# Patient Record
Sex: Female | Born: 1945 | Race: White | Hispanic: No | Marital: Married | Smoking: Former smoker
Health system: Southern US, Community
[De-identification: ages and names within clinical notes are randomized; demographics above are authoritative.]

## PROBLEM LIST (undated history)

## (undated) DIAGNOSIS — E279 Disorder of adrenal gland, unspecified: Secondary | ICD-10-CM

## (undated) DIAGNOSIS — C569 Malignant neoplasm of unspecified ovary: Secondary | ICD-10-CM

## (undated) DIAGNOSIS — IMO0002 Reserved for concepts with insufficient information to code with codable children: Secondary | ICD-10-CM

## (undated) DIAGNOSIS — I10 Essential (primary) hypertension: Secondary | ICD-10-CM

## (undated) DIAGNOSIS — E1151 Type 2 diabetes mellitus with diabetic peripheral angiopathy without gangrene: Secondary | ICD-10-CM

## (undated) DIAGNOSIS — I2510 Atherosclerotic heart disease of native coronary artery without angina pectoris: Secondary | ICD-10-CM

## (undated) HISTORY — DX: Reserved for concepts with insufficient information to code with codable children: IMO0002

## (undated) HISTORY — DX: Type 2 diabetes mellitus with diabetic peripheral angiopathy without gangrene: E11.51

## (undated) HISTORY — PX: PARTIAL HYSTERECTOMY: SHX80

## (undated) HISTORY — PX: TONSILLECTOMY: SUR1361

## (undated) HISTORY — PX: CHOLECYSTECTOMY: SHX55

## (undated) HISTORY — DX: Disorder of adrenal gland, unspecified: E27.9

## (undated) HISTORY — DX: Essential (primary) hypertension: I10

## (undated) HISTORY — PX: CARDIAC CATHETERIZATION: SHX172

## (undated) HISTORY — DX: Malignant neoplasm of unspecified ovary: C56.9

## (undated) HISTORY — PX: ABDOMINAL HYSTERECTOMY: SHX81

## (undated) HISTORY — DX: Atherosclerotic heart disease of native coronary artery without angina pectoris: I25.10

---

## 1999-05-28 DIAGNOSIS — E1151 Type 2 diabetes mellitus with diabetic peripheral angiopathy without gangrene: Secondary | ICD-10-CM

## 1999-05-28 HISTORY — DX: Type 2 diabetes mellitus with diabetic peripheral angiopathy without gangrene: E11.51

## 2003-06-16 ENCOUNTER — Other Ambulatory Visit: Payer: Self-pay

## 2003-06-17 ENCOUNTER — Other Ambulatory Visit: Payer: Self-pay

## 2004-03-15 ENCOUNTER — Ambulatory Visit: Payer: Self-pay | Admitting: Physician Assistant

## 2004-04-12 ENCOUNTER — Ambulatory Visit: Payer: Self-pay | Admitting: Physician Assistant

## 2004-05-09 ENCOUNTER — Ambulatory Visit: Payer: Self-pay | Admitting: Pain Medicine

## 2004-05-22 ENCOUNTER — Ambulatory Visit: Payer: Self-pay | Admitting: Pain Medicine

## 2004-05-25 ENCOUNTER — Ambulatory Visit: Payer: Self-pay | Admitting: Pain Medicine

## 2004-05-30 ENCOUNTER — Ambulatory Visit: Payer: Self-pay

## 2004-06-04 ENCOUNTER — Ambulatory Visit: Payer: Self-pay | Admitting: Pain Medicine

## 2004-06-12 ENCOUNTER — Ambulatory Visit: Payer: Self-pay | Admitting: Pain Medicine

## 2004-07-04 ENCOUNTER — Ambulatory Visit: Payer: Self-pay | Admitting: Physician Assistant

## 2004-07-31 ENCOUNTER — Ambulatory Visit: Payer: Self-pay | Admitting: Physician Assistant

## 2004-08-28 ENCOUNTER — Ambulatory Visit: Payer: Self-pay | Admitting: Physician Assistant

## 2004-10-04 ENCOUNTER — Ambulatory Visit: Payer: Self-pay | Admitting: Physician Assistant

## 2004-11-01 ENCOUNTER — Ambulatory Visit: Payer: Self-pay | Admitting: Physician Assistant

## 2004-11-29 ENCOUNTER — Ambulatory Visit: Payer: Self-pay | Admitting: Physician Assistant

## 2005-01-04 ENCOUNTER — Ambulatory Visit: Payer: Self-pay | Admitting: Physician Assistant

## 2005-01-24 ENCOUNTER — Ambulatory Visit: Payer: Self-pay | Admitting: Physician Assistant

## 2005-01-31 ENCOUNTER — Ambulatory Visit: Payer: Self-pay | Admitting: Physician Assistant

## 2005-03-13 ENCOUNTER — Ambulatory Visit: Payer: Self-pay | Admitting: Physician Assistant

## 2005-03-20 ENCOUNTER — Ambulatory Visit: Payer: Self-pay | Admitting: Physician Assistant

## 2005-04-16 ENCOUNTER — Ambulatory Visit: Payer: Self-pay | Admitting: Physician Assistant

## 2005-05-14 ENCOUNTER — Ambulatory Visit: Payer: Self-pay | Admitting: Physician Assistant

## 2005-06-14 ENCOUNTER — Ambulatory Visit: Payer: Self-pay | Admitting: Physician Assistant

## 2005-07-24 ENCOUNTER — Ambulatory Visit: Payer: Self-pay | Admitting: Physician Assistant

## 2005-08-26 ENCOUNTER — Ambulatory Visit: Payer: Self-pay | Admitting: Physician Assistant

## 2005-09-23 ENCOUNTER — Ambulatory Visit: Payer: Self-pay | Admitting: Physician Assistant

## 2005-09-24 ENCOUNTER — Encounter: Payer: Self-pay | Admitting: Pain Medicine

## 2005-10-23 ENCOUNTER — Ambulatory Visit: Payer: Self-pay | Admitting: Internal Medicine

## 2005-10-24 ENCOUNTER — Ambulatory Visit: Payer: Self-pay | Admitting: Physician Assistant

## 2005-11-20 ENCOUNTER — Ambulatory Visit: Payer: Self-pay | Admitting: Physician Assistant

## 2005-12-26 ENCOUNTER — Ambulatory Visit: Payer: Self-pay | Admitting: Physician Assistant

## 2006-02-06 ENCOUNTER — Ambulatory Visit: Payer: Self-pay | Admitting: Physician Assistant

## 2006-03-10 ENCOUNTER — Ambulatory Visit: Payer: Self-pay | Admitting: Physician Assistant

## 2006-04-08 ENCOUNTER — Ambulatory Visit: Payer: Self-pay | Admitting: Physician Assistant

## 2006-05-08 ENCOUNTER — Ambulatory Visit: Payer: Self-pay | Admitting: Physician Assistant

## 2006-06-05 ENCOUNTER — Ambulatory Visit: Payer: Self-pay | Admitting: Internal Medicine

## 2006-06-17 ENCOUNTER — Ambulatory Visit: Payer: Self-pay | Admitting: Physician Assistant

## 2006-07-17 ENCOUNTER — Ambulatory Visit: Payer: Self-pay | Admitting: Physician Assistant

## 2006-08-13 ENCOUNTER — Ambulatory Visit: Payer: Self-pay | Admitting: Physician Assistant

## 2006-09-11 ENCOUNTER — Ambulatory Visit: Payer: Self-pay | Admitting: Physician Assistant

## 2006-10-21 ENCOUNTER — Ambulatory Visit: Payer: Self-pay | Admitting: Physician Assistant

## 2006-11-17 ENCOUNTER — Ambulatory Visit: Payer: Self-pay | Admitting: Physician Assistant

## 2006-12-18 ENCOUNTER — Ambulatory Visit: Payer: Self-pay | Admitting: Physician Assistant

## 2006-12-23 ENCOUNTER — Encounter: Payer: Self-pay | Admitting: Pain Medicine

## 2007-01-21 ENCOUNTER — Ambulatory Visit: Payer: Self-pay | Admitting: Physician Assistant

## 2007-02-17 ENCOUNTER — Ambulatory Visit: Payer: Self-pay | Admitting: Physician Assistant

## 2007-03-23 ENCOUNTER — Ambulatory Visit: Payer: Self-pay | Admitting: Physician Assistant

## 2007-06-18 ENCOUNTER — Ambulatory Visit: Payer: Self-pay | Admitting: Physician Assistant

## 2007-07-24 ENCOUNTER — Ambulatory Visit: Payer: Self-pay | Admitting: Physician Assistant

## 2007-10-20 ENCOUNTER — Ambulatory Visit: Payer: Self-pay | Admitting: Physician Assistant

## 2007-11-19 ENCOUNTER — Ambulatory Visit: Payer: Self-pay | Admitting: Physician Assistant

## 2007-12-17 ENCOUNTER — Ambulatory Visit: Payer: Self-pay | Admitting: Physician Assistant

## 2008-01-20 ENCOUNTER — Ambulatory Visit: Payer: Self-pay | Admitting: Internal Medicine

## 2008-01-21 ENCOUNTER — Ambulatory Visit: Payer: Self-pay | Admitting: Internal Medicine

## 2008-03-17 ENCOUNTER — Ambulatory Visit: Payer: Self-pay | Admitting: Physician Assistant

## 2008-04-13 ENCOUNTER — Ambulatory Visit: Payer: Self-pay | Admitting: Physician Assistant

## 2008-06-16 ENCOUNTER — Ambulatory Visit: Payer: Self-pay | Admitting: Pain Medicine

## 2008-09-19 ENCOUNTER — Ambulatory Visit: Payer: Self-pay | Admitting: Physician Assistant

## 2008-12-15 ENCOUNTER — Ambulatory Visit: Payer: Self-pay | Admitting: Physician Assistant

## 2009-01-24 ENCOUNTER — Ambulatory Visit: Payer: Self-pay | Admitting: Physician Assistant

## 2009-02-07 ENCOUNTER — Ambulatory Visit: Payer: Self-pay | Admitting: Pain Medicine

## 2009-02-16 ENCOUNTER — Ambulatory Visit: Payer: Self-pay | Admitting: Physician Assistant

## 2009-04-13 ENCOUNTER — Ambulatory Visit: Payer: Self-pay | Admitting: Physician Assistant

## 2009-05-09 ENCOUNTER — Ambulatory Visit: Payer: Self-pay | Admitting: Pain Medicine

## 2009-05-16 ENCOUNTER — Ambulatory Visit: Payer: Self-pay | Admitting: Physician Assistant

## 2009-06-01 ENCOUNTER — Ambulatory Visit: Payer: Self-pay | Admitting: General Surgery

## 2009-06-13 ENCOUNTER — Ambulatory Visit: Payer: Self-pay | Admitting: General Surgery

## 2009-06-15 ENCOUNTER — Ambulatory Visit: Payer: Self-pay | Admitting: Physician Assistant

## 2009-06-15 ENCOUNTER — Encounter (INDEPENDENT_AMBULATORY_CARE_PROVIDER_SITE_OTHER): Payer: Self-pay | Admitting: *Deleted

## 2009-07-13 ENCOUNTER — Ambulatory Visit: Payer: Self-pay | Admitting: Pain Medicine

## 2009-08-03 ENCOUNTER — Ambulatory Visit: Payer: Self-pay | Admitting: General Surgery

## 2009-08-03 ENCOUNTER — Ambulatory Visit: Payer: Self-pay | Admitting: Internal Medicine

## 2009-08-22 ENCOUNTER — Ambulatory Visit: Payer: Self-pay | Admitting: General Surgery

## 2009-11-29 ENCOUNTER — Ambulatory Visit: Payer: Self-pay | Admitting: Internal Medicine

## 2010-03-02 ENCOUNTER — Encounter: Payer: Self-pay | Admitting: Cardiovascular Disease

## 2010-03-13 ENCOUNTER — Other Ambulatory Visit: Payer: Self-pay

## 2010-03-19 ENCOUNTER — Ambulatory Visit: Payer: Self-pay | Admitting: Cardiovascular Disease

## 2010-03-19 ENCOUNTER — Observation Stay: Payer: Self-pay | Admitting: Internal Medicine

## 2010-03-20 ENCOUNTER — Encounter: Payer: Self-pay | Admitting: Cardiovascular Disease

## 2010-03-26 ENCOUNTER — Encounter: Payer: Self-pay | Admitting: Cardiovascular Disease

## 2010-03-30 ENCOUNTER — Ambulatory Visit: Payer: Self-pay | Admitting: Cardiovascular Disease

## 2010-04-16 ENCOUNTER — Ambulatory Visit: Payer: Self-pay | Admitting: Cardiovascular Disease

## 2010-04-16 DIAGNOSIS — I2510 Atherosclerotic heart disease of native coronary artery without angina pectoris: Secondary | ICD-10-CM | POA: Insufficient documentation

## 2010-04-16 DIAGNOSIS — I739 Peripheral vascular disease, unspecified: Secondary | ICD-10-CM

## 2010-04-16 DIAGNOSIS — E785 Hyperlipidemia, unspecified: Secondary | ICD-10-CM

## 2010-04-16 DIAGNOSIS — E119 Type 2 diabetes mellitus without complications: Secondary | ICD-10-CM

## 2010-05-15 ENCOUNTER — Ambulatory Visit: Payer: Self-pay

## 2010-06-26 ENCOUNTER — Encounter: Payer: Self-pay | Admitting: Cardiovascular Disease

## 2010-06-26 ENCOUNTER — Ambulatory Visit: Admission: RE | Admit: 2010-06-26 | Discharge: 2010-06-26 | Payer: Self-pay | Source: Home / Self Care

## 2010-06-26 NOTE — Letter (Signed)
Summary: Cardiac Catheterization Instructions- Main Lab  [facility] at [facility] [address]   [place], [place] [address]   Phone: [phone]  Fax: [phone]     03/26/2010 MRN: [id number]  [facility] [address] [place], [place]  [address]  Dear Ms. Kitchen,   You are scheduled for Cardiac Catheterization on  November 4th              with Dr.Gollan.  Please arrive at the [facility] at 7 a.m. on the day of your procedure.  1. DIET     __x__ Nothing to eat or drink after midnight except your medications with a sip of water.  2. Come to the [place] office on  10/31   3. MAKE SURE YOU TAKE YOUR ASPIRIN.  4. _____ DO NOT TAKE these medications before your procedure:         ________________________________________________________________________________      ____ YOU MAY TAKE ALL of your remaining medications with a small amount of water.   5. Plan for one night stay - bring personal belongings (i.e. toothpaste, toothbrush, etc.)  6. Bring a current list of your medications and current insurance cards.  7. Must have a responsible person to drive you home.   8. Someone must be with yu for the first 24 hours after you arrive home.  9. Please wear clothes that are easy to get on and off and wear slip-on shoes.  *Special note: Every effort is made to have your procedure done on time.  Occasionally there are emergencies that present themselves at the hospital that may cause delays.  Please be patient if a delay does occur.  If you have any questions after you get home, please call the office at the number listed above.  Benedict Needy, RN

## 2010-06-26 NOTE — Letter (Signed)
[facility]   [facility]   Imported By: Roderic Ovens 03/23/2010 16:10:34  _____________________________________________________________________  External Attachment:    Type:   Image     Comment:   External Document

## 2010-06-26 NOTE — Assessment & Plan Note (Signed)
Summary: NP6/AMD   Visit Type:  Initial Consult Primary Vilda Zollner:  Tora Kindred Khan,M.D.  CC:  F/U [facility]. Had one spell of chest pain since left [facility].Marland Kitchen  History of Present Illness: Janet Arnold a 65 year old woman with a coronary artery, recent admssion at [facility] for chest pain on 03/19/2010 also with history of hypertension, diabetes, hyperlipidemia, peripheral vascular disease with stents placed he iliac arteries on the left but the details are unavailable, and smoking who presents for followup after recent hospital discharge and cardiac catheterization.  Cardiac catheterization was performed given her symptoms of chest discomfort. She had mild to moderate distal left main disease, mild RCA disease estimated at 40%. Normal LV systolic function.  she has done well since her cardiac catheterization. She does have rare episodes of chest discomfort. She tried Imdur 15 mg this gave her a significant headache. she has stopped smoking since she was discharged, as has her husband.  No pain at the groin site from the cardiac catheterization.  She has had pain simvastatin. She was started on zetia 10 mg daily.  Most recent hemoglobin A1c is 6.8  EKG shows sinus bradycardia with rate 55 beats per minute, no significant ST or T wave changes  Preventive Screening-Counseling & Management  Alcohol-Tobacco     Smoking Status: quit  Caffeine-Diet-Exercise     Does Patient Exercise: yes      Drug Use:  no.    Current Medications (verified): 1)  Amlodipine Besylate 10 Mg Tabs (Amlodipine Besylate) .Marland Kitchen.. 1 Tablet By Mouth At Bedtime 2)  Metoprolol Tartrate 100 Mg Tabs (Metoprolol Tartrate) .Marland Kitchen.. 1 Tablet By Mouth Two Times A Day 3)  Pantoprazole Sodium 40 Mg Tbec (Pantoprazole Sodium) .Marland Kitchen.. 1 Tablet By Mouth Once Daily 4)  Oxycodone-Acetaminophen 5-325 Mg Tabs (Oxycodone-Acetaminophen) .Marland Kitchen.. 1 Up To 5 Times Once Daily As Needed 5)  Glimepiride 2 Mg Tabs (Glimepiride) .Marland Kitchen.. 1 Tablet By Mouth Two Times A Day 6)   Metformin Hcl 500 Mg Tabs (Metformin Hcl) .Marland Kitchen.. 1 Tablet By Mouth Once Daily 7)  Zocor 20 Mg Tabs (Simvastatin) .... One Tablet At Bedtime 8)  Aspir-Low 81 Mg Tbec (Aspirin) .... One Tablet Once Daily 9)  Nitrostat 0.4 Mg Subl (Nitroglycerin) .... One Tablet Once Daily As Needed 10)  Lyrica 75 Mg Caps (Pregabalin) .... Three Times A Day  Allergies (verified): 1)  ! Dilantin (Phenytoin Sodium Extended)  Past History:  Past Medical History: Last updated: 07/03/2009 Hypertension Diabetes DDD  Past Surgical History: Last updated: 07/03/2009 Cholecystectomy Hysterectomy-partial Tonsillectomy  Family History: Last updated: 05/12/2010 Mother: deceased; alzhemier's disease Father: deceased; cancer  Social History: Last updated: 2010/05/12 Retired  Married  Tobacco Use - Former. Smoked 1 1/2 PPD. Quit 03/30/2010. Alcohol Use - no Regular Exercise - yes--moderate Drug Use - no  Risk Factors: Exercise: yes (05/12/10)  Risk Factors: Smoking Status: quit (May 12, 2010)  Family History: Mother: deceased; alzhemier's disease Father: deceased; cancer  Social History: Retired  Married  Tobacco Use - Former. Smoked 1 1/2 PPD. Quit 03/30/2010. Alcohol Use - no Regular Exercise - yes--moderate Drug Use - no Smoking Status:  quit Does Patient Exercise:  yes Drug Use:  no  Review of Systems       The patient complains of chest pain.  The patient denies fever, weight loss, weight gain, vision loss, decreased hearing, hoarseness, syncope, dyspnea on exertion, peripheral edema, prolonged cough, abdominal pain, incontinence, muscle weakness, depression, and enlarged lymph nodes.    Vital Signs:  Patient profile:   64 year  old female Height:      64 inches Weight:      165.50 pounds BMI:     28.51 Pulse rate:   55 / minute BP sitting:   126 / 70  (left arm) Cuff size:   regular  Vitals Entered By: Bishop Dublin, CMA (April 16, 2010 2:56 PM)  Physical Exam  General:   Well developed, well nourished, in no acute distress. Head:  normocephalic and atraumatic Neck:  Neck supple, no JVD. No masses, thyromegaly or abnormal cervical nodes. Lungs:  Clear bilaterally to auscultation and percussion. Heart:  Non-displaced PMI, chest non-tender; regular rate and rhythm, S1, S2 without murmurs, rubs or gallops. Carotid upstroke normal, no bruit.  Pedals normal pulses. No edema, no varicosities. Abdomen:  Bowel sounds positive; abdomen soft and non-tender without masses Msk:  Back normal, normal gait. Muscle strength and tone normal. Pulses:  pulses normal in all 4 extremities Extremities:  No clubbing or cyanosis. Neurologic:  Alert and oriented x 3. Skin:  Intact without lesions or rashes. Psych:  Normal affect.   Impression & Recommendations:  Problem # 1:  CAD, NATIVE VESSEL (ICD-414.01) mild to moderate disease. We'll continue aggressive medical management. Goal LDL is less than 70 given her left main disease.  The following medications were removed from the medication list:    Isosorbide Mononitrate Cr 30 Mg Xr24h-tab (Isosorbide mononitrate) .Marland Kitchen... Take 1/2-1 tablet by mouth daily Her updated medication list for this problem includes:    Amlodipine Besylate 10 Mg Tabs (Amlodipine besylate) .Marland Kitchen... 1 tablet by mouth at bedtime    Metoprolol Tartrate 100 Mg Tabs (Metoprolol tartrate) .Marland Kitchen... 1 tablet by mouth two times a day    Aspir-low 81 Mg Tbec (Aspirin) ..... One tablet once daily    Nitrostat 0.4 Mg Subl (Nitroglycerin) ..... One tablet once daily as needed  Problem # 2:  PVD (ICD-443.9) she reports having stents placed in her legs. She has not had any ultrasound in over 5 years. We will order this for her. She reports having a ultrasound of her carotids recently we'll try to obtain these records.  Problem # 3:  HYPERLIPIDEMIA-MIXED (ICD-272.4) given her underlying left main disease, history of smoking, we have suggested that she retry Crestor 5 mg every  other day with her zetia.  Her updated medication list for this problem includes:    Crestor 5 Mg Tabs (Rosuvastatin calcium) .Marland Kitchen... Take 1 tablet by mouth every other day    Zetia 10 Mg Tabs (Ezetimibe) .Marland Kitchen... Take one tablet by mouth daily.  Problem # 4:  DM (ICD-250.00) We have encouraged her to continue good diabetes control with goal hemoglobin A1c less than 6.5.  Her updated medication list for this problem includes:    Glimepiride 2 Mg Tabs (Glimepiride) .Marland Kitchen... 1 tablet by mouth two times a day    Metformin Hcl 500 Mg Tabs (Metformin hcl) .Marland Kitchen... 1 tablet by mouth once daily    Aspir-low 81 Mg Tbec (Aspirin) ..... One tablet once daily  Other Orders: Arterial Duplex Lower Extremity (Arterial Duplex Low) Abdominal Aorta Duplex (Abd Aorta Duplex)  Patient Instructions: 1)  Your physician recommends that you schedule a follow-up appointment in: 6 months 2)  Your physician has recommended you make the following change in your medication: Start taking Crestor 5mg  every other day. 3)  Your physician has requested that you have an abdominal aorta duplex. During this test, an ultrasound is used to evaluate the aorta. Allow 30 minutes for this exam.  Do not eat after midnight the day before and avoid carbonated beverages. There are no restrictions or special instructions. 4)  Your physician has requested that you have a lower or upper extremity arterial duplex.  This test is an ultrasound of the arteries in the legs or arms.  It looks at arterial blood flow in the legs and arms.  Allow one hour for Lower and Upper Arterial scans. There are no restrictions or special instructions.

## 2010-06-26 NOTE — Cardiovascular Report (Signed)
Summary: [facility] - Cath  [facility] - Cath   Imported By: Marylou Mccoy 04/30/2010 14:59:34  _____________________________________________________________________  External Attachment:    Type:   Image     Comment:   External Document

## 2010-06-26 NOTE — Letter (Signed)
Summary: New Patient letter  [facility]  [address] [place], [place] [address]   Phone: [phone]  Fax: [phone]       06/15/2009 MRN: [id number]  [facility] [address] [place], [place]  [address]  Dear Ms. Pint,  Welcome to the Gastroenterology Division at [facility].    You are scheduled to see Dr.  Marina Goodell on 07/06/2009 at 9:15am on the 3rd floor at [facility], [address]  We ask that you try to arrive at our office 15 minutes prior to your appointment time to allow for check-in.  We would like you to complete the enclosed self-administered evaluation form prior to your visit and bring it with you on the day of your appointment.  We will review it with you.  Also, please bring a complete list of all your medications or, if you prefer, bring the medication bottles and we will list them.  Please bring your insurance card so that we may make a copy of it.  If your insurance requires a referral to see a specialist, please bring your referral form from your primary care physician.  Co-payments are due at the time of your visit and may be paid by cash, check or credit card.     Your office visit will consist of a consult with your physician (includes a physical exam), any laboratory testing he/she may order, scheduling of any necessary diagnostic testing (e.g. x-ray, ultrasound, CT-scan), and scheduling of a procedure (e.g. Endoscopy, Colonoscopy) if required.  Please allow enough time on your schedule to allow for any/all of these possibilities.    If you cannot keep your appointment, please call [phone] to cancel or reschedule prior to your appointment date.  This allows [place] the opportunity to schedule an appointment for another patient in need of care.  If you do not cancel or reschedule by 5 p.m. the business day prior to your appointment date, you will be charged a $50.00 late cancellation/no-show fee.    Thank you for  choosing [facility] for your medical needs.  We appreciate the opportunity to care for you.  Please visit [place] at our website  to learn more about our practice.                     Sincerely,                                                             The Gastroenterology Division

## 2010-06-26 NOTE — Miscellaneous (Signed)
Summary: Isosorbide  Clinical Lists Changes  Medications: Added new medication of ISOSORBIDE MONONITRATE CR 30 MG XR24H-TAB (ISOSORBIDE MONONITRATE) Take 1/2-1 tablet by mouth daily - Signed Rx of ISOSORBIDE MONONITRATE CR 30 MG XR24H-TAB (ISOSORBIDE MONONITRATE) Take 1/2-1 tablet by mouth daily;  #30 x 3;  Signed;  Entered by: Benedict Needy, RN;  Authorized by: Dossie Arbour MD;  Method used: [id number] [address], [place], [place], [place]  [address], Ph: [phone], Fax: [phone]    Prescriptions: ISOSORBIDE MONONITRATE CR 30 MG XR24H-TAB (ISOSORBIDE MONONITRATE) Take 1/2-1 tablet by mouth daily  #30 x 3   Entered by:   Benedict Needy, RN   Authorized by:   Dossie Arbour MD   Signed by:   Benedict Needy, RN on 03/30/2010   Method used:   [facility]ronically to        [facility] [phone]* (retail)       [address]       [place], [place]  [address]       Ph: [phone]       Fax: [phone]   RxID:   [id number]

## 2010-07-26 DIAGNOSIS — E279 Disorder of adrenal gland, unspecified: Secondary | ICD-10-CM

## 2010-07-26 HISTORY — DX: Disorder of adrenal gland, unspecified: E27.9

## 2010-07-30 ENCOUNTER — Ambulatory Visit: Payer: Self-pay | Admitting: Internal Medicine

## 2010-08-07 ENCOUNTER — Other Ambulatory Visit: Payer: Self-pay | Admitting: Unknown Physician Specialty

## 2010-08-14 ENCOUNTER — Ambulatory Visit: Payer: Self-pay | Admitting: Internal Medicine

## 2010-11-07 ENCOUNTER — Ambulatory Visit (INDEPENDENT_AMBULATORY_CARE_PROVIDER_SITE_OTHER): Payer: Medicare Other | Admitting: Cardiovascular Disease

## 2010-11-07 ENCOUNTER — Encounter: Payer: Self-pay | Admitting: Cardiovascular Disease

## 2010-11-07 DIAGNOSIS — I2510 Atherosclerotic heart disease of native coronary artery without angina pectoris: Secondary | ICD-10-CM

## 2010-11-07 DIAGNOSIS — R079 Chest pain, unspecified: Secondary | ICD-10-CM | POA: Insufficient documentation

## 2010-11-07 DIAGNOSIS — E785 Hyperlipidemia, unspecified: Secondary | ICD-10-CM

## 2010-11-07 DIAGNOSIS — I739 Peripheral vascular disease, unspecified: Secondary | ICD-10-CM

## 2010-11-07 DIAGNOSIS — E119 Type 2 diabetes mellitus without complications: Secondary | ICD-10-CM

## 2010-11-07 MED ORDER — NITROGLYCERIN 0.4 MG SL SUBL: 0.4000 mg | SUBLINGUAL_TABLET | SUBLINGUAL | Status: AC | PRN

## 2010-11-07 NOTE — Assessment & Plan Note (Signed)
Recent cardiac catheterization with noncritical disease. We'll continue medical management. We have stressed and she tried red yeast rice.

## 2010-11-07 NOTE — Assessment & Plan Note (Signed)
Chest pain is atypical in nature as detailed above. We have given her nitroglycerin more for symptom relief which might help her if she has esophageal spasm or hiatal hernia. We have suggested she talk with Dr. Mechele Collin and their group about moving to scheduling her for EGD. She is having some difficulty swallowing as well. Uncertain if this is from stricture or hiatal hernia or other etiology. She will call me if she has any symptoms with exertion and we would perform a treadmill though this would likely be low yield as she had a recent cardiac catheterization that showed no significant coronary artery disease.

## 2010-11-07 NOTE — Assessment & Plan Note (Signed)
No symptoms of claudication at this time. Again we will continue to work with her on her cholesterol. She is a nonsmoker.

## 2010-11-07 NOTE — Patient Instructions (Addendum)
You are doing well. No medication changes were made. Start Red Yeast Rice otc once daily. Please call us if you have new issues that need to be addressed before your next appt.  We will call you for a follow up Appt. 6 months

## 2010-11-07 NOTE — Assessment & Plan Note (Signed)
She has had a difficult time tolerating statins. Uncertain why she was not able to tolerate zetia. We have suggested red yeast rice, exercise and weight loss.

## 2010-11-07 NOTE — Assessment & Plan Note (Signed)
We have encouraged continued exercise, careful diet management in an effort to lose weight. 

## 2010-11-07 NOTE — Progress Notes (Signed)
   Patient ID: Janet Arnold, female    DOB: Sep 04, 1945, 65 y.o.   MRN: [id number]  HPI Comments: Janet Arnold a 65 year old woman with CAD, admssion at [facility] for chest pain on 03/19/2010 also with history of hypertension, diabetes, hyperlipidemia, peripheral vascular disease with stents placed in the iliac artery on the left but the details are unavailable, and h/o smoking who presents for followup.    Cardiac catheterization earlier this year was performed given her symptoms of chest discomfort. She had mild to moderate distal left main disease, mild RCA disease estimated at 40%. Normal LV systolic function.   She had done well following her cardiac catheterization until recently when she has been developing some right upper epigastric, central epigastric, chest pain in the central mediastinal region radiating up her neck on the right to her head. She reports it is worse with leaning forward, seems to be resolved with belching. It comes on at rest, sometimes while sitting and sometimes while sleeping. Sometimes food seems to make it better. She takes care of a 1-year-old grandchild and does not have any shortness of breath or chest discomfort when she is taking care of him and was walking with him.  She has had pain With statins  hemoglobin A1c is 6.8   EKG shows sinus bradycardia with rate 56 beats per minute, no significant ST or T wave changes      Review of Systems  Constitutional: Negative.   HENT: Negative.   Eyes: Negative.   Respiratory: Negative.   Cardiovascular: Positive for chest pain.  Gastrointestinal: Positive for abdominal pain.  Musculoskeletal: Negative.   Skin: Negative.   Neurological: Negative.   Hematological: Negative.   Psychiatric/Behavioral: Negative.   All other systems reviewed and are negative.    BP 129/75  Pulse 56  Ht 5\' 5"  (1.651 m)  Wt 163 lb (73.936 kg)  BMI 27.12 kg/m2   Physical Exam  Nursing note and vitals reviewed. Constitutional: She is  oriented to person, place, and time. She appears well-developed and well-nourished.  HENT:  Head: Normocephalic.  Nose: Nose normal.  Mouth/Throat: Oropharynx is clear and moist.  Eyes: Conjunctivae are normal. Pupils are equal, round, and reactive to light.  Neck: Normal range of motion. Neck supple. No JVD present.  Cardiovascular: Normal rate, regular rhythm, S1 normal, S2 normal, normal heart sounds and intact distal pulses.  Exam reveals no gallop and no friction rub.   No murmur heard. Pulmonary/Chest: Effort normal and breath sounds normal. No respiratory distress. She has no wheezes. She has no rales. She exhibits no tenderness.  Abdominal: Soft. Bowel sounds are normal. She exhibits no distension. There is no tenderness.  Musculoskeletal: Normal range of motion. She exhibits no edema and no tenderness.  Lymphadenopathy:    She has no cervical adenopathy.  Neurological: She is alert and oriented to person, place, and time. Coordination normal.  Skin: Skin is warm and dry. No rash noted. No erythema.  Psychiatric: She has a normal mood and affect. Her behavior is normal. Judgment and thought content normal.         Assessment and Plan

## 2010-12-05 ENCOUNTER — Ambulatory Visit: Payer: Self-pay | Admitting: Unknown Physician Specialty

## 2010-12-07 ENCOUNTER — Other Ambulatory Visit: Payer: Self-pay | Admitting: Unknown Physician Specialty

## 2010-12-19 ENCOUNTER — Ambulatory Visit: Payer: Self-pay | Admitting: Unknown Physician Specialty

## 2010-12-21 LAB — PATHOLOGY REPORT

## 2011-06-11 ENCOUNTER — Ambulatory Visit: Payer: Self-pay | Admitting: Unknown Physician Specialty

## 2011-06-13 LAB — PATHOLOGY REPORT

## 2011-08-01 ENCOUNTER — Ambulatory Visit: Payer: Self-pay

## 2011-08-13 ENCOUNTER — Encounter: Payer: Self-pay | Admitting: Internal Medicine

## 2011-08-13 ENCOUNTER — Ambulatory Visit (INDEPENDENT_AMBULATORY_CARE_PROVIDER_SITE_OTHER): Payer: Medicare Other | Admitting: Internal Medicine

## 2011-08-13 VITALS — BP 120/64 | HR 80 | Temp 98.2°F | Ht 63.0 in | Wt 155.1 lb

## 2011-08-13 DIAGNOSIS — I10 Essential (primary) hypertension: Secondary | ICD-10-CM

## 2011-08-13 DIAGNOSIS — E1151 Type 2 diabetes mellitus with diabetic peripheral angiopathy without gangrene: Secondary | ICD-10-CM | POA: Insufficient documentation

## 2011-08-13 DIAGNOSIS — I798 Other disorders of arteries, arterioles and capillaries in diseases classified elsewhere: Secondary | ICD-10-CM

## 2011-08-13 DIAGNOSIS — E1159 Type 2 diabetes mellitus with other circulatory complications: Secondary | ICD-10-CM

## 2011-08-13 DIAGNOSIS — Z1239 Encounter for other screening for malignant neoplasm of breast: Secondary | ICD-10-CM

## 2011-08-13 DIAGNOSIS — E119 Type 2 diabetes mellitus without complications: Secondary | ICD-10-CM

## 2011-08-13 DIAGNOSIS — I739 Peripheral vascular disease, unspecified: Secondary | ICD-10-CM

## 2011-08-13 DIAGNOSIS — I2510 Atherosclerotic heart disease of native coronary artery without angina pectoris: Secondary | ICD-10-CM | POA: Insufficient documentation

## 2011-08-13 DIAGNOSIS — E785 Hyperlipidemia, unspecified: Secondary | ICD-10-CM

## 2011-08-13 NOTE — Progress Notes (Addendum)
Patient ID: Janet Arnold, female   DOB: 1946-05-04, 66 y.o.   MRN: [id number]   Patient Active Problem List  Diagnoses  . DM  . HYPERLIPIDEMIA-MIXED  . CAD, NATIVE VESSEL  . PVD  . Chest pain  . Coronary artery disease  . Diabetes mellitus with peripheral artery disease    Subjective:  CC:   Chief Complaint  Patient presents with  . Establish Care    HPI:   Janet Arnold a 66 y.o. female who presents with  left thigh pain.  Janet Arnold is a  66 year old female wit who is transferring primary care from [facility] . 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 She is currently under surveillance of an adrenal nodule which is being followed with serial CAT scans. She also recently had a thyroid nodule discovered and is going to be set up for an ultrasound of that by endocrinologist Dr. Tedd Sias.      she has 4-6 loose bowel movements daily due to the diarrhea predominant irritable bowel syndrome. This is managed with Bentyl some days are better than others. She does have some food triggers   her last colonoscopy was last year by Dr. and she has annual surveillance colonoscopies due to multiple large polyps found. .  she has a history of peripheral vascular disease and underwent stenting of presumed left iliac artery back in 2001 at [facility]. She was a smoker at the time and did not quit smoking about 2 years ago. She has had no vascular in several years. She has noted that for the past 6 months she is having pain in the left groin and left thigh brought on by activity specifically walking and relieved with rest. Additionally she has chronic intermittent low back pain which is managed with a TENS unit . Intermittently occurring and radiating down her right leg. No prior back surgery.    Past Medical History  Diagnosis Date  . Hypertension   . Diabetes mellitus   . DDD (degenerative disc disease)   . Coronary artery disease     last cath 2011,  nonocclusive  . Diabetes mellitus with  peripheral artery disease 2001    s/p stent to right iliac artery (?) done at [facility]     Past Surgical History  Procedure Date  . Cholecystectomy   . Partial hysterectomy   . Tonsillectomy   . Abdominal hysterectomy          The following portions of the patient's history were reviewed and updated as appropriate: Allergies, current medications, and problem list.    Review of Systems:   12 Pt  review of systems was negative except those addressed in the HPI,     History   Social History  . Marital Status: Single    Spouse Name: N/A    Number of Children: N/A  . Years of Education: N/A   Occupational History  . Not on file.   Social History Main Topics  . Smoking status: Former Smoker -- 1.5 packs/day    Types: Cigarettes    Quit date: 03/30/2010  . Smokeless tobacco: Never Used  . Alcohol Use: No  . Drug Use: No  . Sexually Active:    Other Topics Concern  . Not on file   Social History Narrative  . No narrative on file    Objective:  BP 120/64  Pulse 80  Temp(Src) 98.2 F (36.8 C) (Oral)  Ht 5\' 3"  (1.6 m)  Wt 155 lb 1.9 oz (70.362 kg)  BMI 27.48 kg/m2  General appearance: alert, cooperative and appears stated age Ears: normal TM's and external ear canals both ears Throat: lips, mucosa, and tongue normal; teeth and gums normal Neck: no adenopathy, no carotid bruit, supple, symmetrical, trachea midline and thyroid not enlarged, symmetric, no tenderness/mass/nodules Back: symmetric, no curvature. ROM normal. No CVA tenderness. Lungs: clear to auscultation bilaterally Heart: regular rate and rhythm, S1, S2 normal, no murmur, click, rub or gallop Abdomen: soft, non-tender; bowel sounds normal; no masses,  no organomegaly Pulses: 2+ and symmetric Skin: Skin color, texture, turgor normal. No rashes or lesions Lymph nodes: Cervical, supraclavicular, and axillary nodes normal.  Assessment and Plan:  Patient Active Problem List  Diagnoses  . DM  .  HYPERLIPIDEMIA-MIXED  . CAD, NATIVE VESSEL  . PVD  . Chest pain  . Coronary artery disease  . Diabetes mellitus with peripheral artery disease  . Hypertension    Subjective:  CC:   Chief Complaint  Patient presents with  . Establish Care    HPI:   Janet Arnold a 66 y.o. female who presents  Past Medical History  Diagnosis Date  . Hypertension   . Diabetes mellitus   . DDD (degenerative disc disease)   . Coronary artery disease     last cath 2011,  nonocclusive  . Diabetes mellitus with peripheral artery disease 2001    s/p stent to right iliac artery (?) done at [facility]     Past Surgical History  Procedure Date  . Cholecystectomy   . Partial hysterectomy   . Tonsillectomy   . Abdominal hysterectomy          The following portions of the patient's history were reviewed and updated as appropriate: Allergies, current medications, and problem list.    Review of Systems:   12 Pt  review of systems was negative except those addressed in the HPI,     History   Social History  . Marital Status: Single    Spouse Name: N/A    Number of Children: N/A  . Years of Education: N/A   Occupational History  . Not on file.   Social History Main Topics  . Smoking status: Former Smoker -- 1.5 packs/day    Types: Cigarettes    Quit date: 03/30/2010  . Smokeless tobacco: Never Used  . Alcohol Use: No  . Drug Use: No  . Sexually Active:    Other Topics Concern  . Not on file   Social History Narrative  . No narrative on file    Objective:  BP 120/64  Pulse 80  Temp(Src) 98.2 F (36.8 C) (Oral)  Ht 5\' 3"  (1.6 m)  Wt 155 lb 1.9 oz (70.362 kg)  BMI 27.48 kg/m2  General appearance: alert, cooperative and appears stated age Ears: normal TM's and external ear canals both ears Throat: lips, mucosa, and tongue normal; teeth and gums normal Neck: no adenopathy, no carotid bruit, supple, symmetrical, trachea midline and thyroid not enlarged, symmetric, no  tenderness/mass/nodules Back: symmetric, no curvature. ROM normal. No CVA tenderness. Lungs: clear to auscultation bilaterally Heart: regular rate and rhythm, S1, S2 normal, no murmur, click, rub or gallop Abdomen: soft, non-tender; bowel sounds normal; no masses,  no organomegaly Pulses: 2+ and symmetric Skin: Skin color, texture, turgor normal. No rashes or lesions Lymph nodes: Cervical, supraclavicular, and axillary nodes normal.  Assessment and Plan:  Diabetes mellitus with peripheral artery disease Managed with oral medications metformin  and sulfonylurea.  She is due for hgba1c.  Reminders for annual eye exam is given.  Foot exam done/.  She takes baby aspirin and is not on an ACE inhibitor despite having DM and CAD.  Reasons unclear.  Will review prior records and address at next visit.    HYPERLIPIDEMIA-MIXED Goal LDL 70, fasting lipids due.  Given her weight and comorbidites,  Will discuss low GI diet.   Hypertension Managed with amlodipine, metoprolol, no ACE inhibitor for unclear reasons.  Will review records ad address next visit.   PVD She is due for vascular evaluation due to persistent leg pain. Referral under way    Updated Medication List Outpatient Encounter Prescriptions as of 08/13/2011  Medication Sig Dispense Refill  . amLODipine (NORVASC) 10 MG tablet Take 10 mg by mouth daily.        Marland Kitchen aspirin 81 MG EC tablet Take 81 mg by mouth daily.        . DULoxetine (CYMBALTA) 60 MG capsule Take 60 mg by mouth daily.      Marland Kitchen glimepiride (AMARYL) 2 MG tablet Take 2 mg by mouth 2 (two) times daily.        . metFORMIN (GLUMETZA) 500 MG (MOD) 24 hr tablet Take 500 mg by mouth daily with breakfast.        . metoprolol (LOPRESSOR) 100 MG tablet Take 100 mg by mouth 2 (two) times daily.        . nitroGLYCERIN (NITROSTAT) 0.4 MG SL tablet Place 1 tablet (0.4 mg total) under the tongue every 5 (five) minutes as needed.  25 tablet  6  . oxyCODONE-acetaminophen (PERCOCET) 5-325 MG  per tablet Take 1 tablet by mouth every 4 (four) hours as needed.        . pantoprazole (PROTONIX) 40 MG tablet Take 40 mg by mouth daily.        . pregabalin (LYRICA) 75 MG capsule Take 75 mg by mouth 3 (three) times daily.           Orders Placed This Encounter  Procedures  . MM Digital Screening  . Lipid panel  . COMPLETE METABOLIC PANEL WITH GFR  . Microalbumin / creatinine urine ratio  . Hemoglobin A1c  . Ambulatory referral to Vascular Surgery    Return in about 3 months (around 11/13/2011).         Return in about 3 months (around 11/13/2011).

## 2011-08-13 NOTE — Patient Instructions (Signed)
Please return for fasting labs at your convenience,  Please drink some water during your overnight fast to prevent dehydration  I am referring you to  [facility] for evaluation of your left thigh pain

## 2011-08-20 ENCOUNTER — Other Ambulatory Visit (INDEPENDENT_AMBULATORY_CARE_PROVIDER_SITE_OTHER): Payer: Medicare Other | Admitting: *Deleted

## 2011-08-20 DIAGNOSIS — E119 Type 2 diabetes mellitus without complications: Secondary | ICD-10-CM

## 2011-08-20 LAB — LIPID PANEL
Cholesterol: 292 mg/dL — ABNORMAL HIGH (ref 0–200)
HDL: 41.3 mg/dL (ref >39.00)
Triglycerides: 204 mg/dL — ABNORMAL HIGH (ref 0.0–149.0)

## 2011-08-20 LAB — MICROALBUMIN / CREATININE URINE RATIO
Creatinine,U: 188.4 mg/dL
Microalb Creat Ratio: 0.5 mg/g (ref 0.0–30.0)
Microalb, Ur: 0.9 mg/dL (ref 0.0–1.9)

## 2011-08-20 LAB — HEMOGLOBIN A1C: Hgb A1c MFr Bld: 7.5 % — ABNORMAL HIGH (ref 4.6–6.5)

## 2011-08-21 DIAGNOSIS — I10 Essential (primary) hypertension: Secondary | ICD-10-CM | POA: Insufficient documentation

## 2011-08-21 LAB — COMPLETE METABOLIC PANEL WITH GFR
Alkaline Phosphatase: 89 U/L (ref 39–117)
BUN: 19 mg/dL (ref 6–23)
CO2: 26 mEq/L (ref 19–32)
Creat: 0.76 mg/dL (ref 0.50–1.10)
GFR, Est African American: >89 mL/min
GFR, Est Non African American: 83 mL/min
Glucose, Bld: 111 mg/dL — ABNORMAL HIGH (ref 70–99)
Total Bilirubin: 0.3 mg/dL (ref 0.3–1.2)

## 2011-08-21 NOTE — Assessment & Plan Note (Addendum)
Managed with oral medications metformin and sulfonylurea.  She is due for hgba1c.  Reminders for annual eye exam is given.  Foot exam done/.  She takes baby aspirin and is not on an ACE inhibitor despite having DM and CAD.  Reasons unclear.  Will review prior records and address at next visit.

## 2011-08-21 NOTE — Assessment & Plan Note (Signed)
Goal LDL 70, fasting lipids due.  Given her weight and comorbidites,  Will discuss low GI diet.

## 2011-08-21 NOTE — Progress Notes (Signed)
Addended by: Duncan Dull on: 08/21/2011 11:38 AM   Modules accepted: Level of Service

## 2011-08-21 NOTE — Assessment & Plan Note (Signed)
Managed with amlodipine, metoprolol, no ACE inhibitor for unclear reasons.  Will review records ad address next visit.

## 2011-08-21 NOTE — Assessment & Plan Note (Signed)
She is due for vascular evaluation due to persistent leg pain. Referral under way

## 2011-08-26 HISTORY — PX: ANGIOPLASTY / STENTING ILIAC: SUR31

## 2011-08-30 ENCOUNTER — Ambulatory Visit (INDEPENDENT_AMBULATORY_CARE_PROVIDER_SITE_OTHER): Payer: Medicare Other | Admitting: Internal Medicine

## 2011-08-30 ENCOUNTER — Encounter: Payer: Self-pay | Admitting: Internal Medicine

## 2011-08-30 VITALS — BP 120/64 | HR 66 | Temp 97.9°F | Resp 16 | Ht 63.5 in | Wt 161.2 lb

## 2011-08-30 DIAGNOSIS — E1151 Type 2 diabetes mellitus with diabetic peripheral angiopathy without gangrene: Secondary | ICD-10-CM

## 2011-08-30 DIAGNOSIS — E785 Hyperlipidemia, unspecified: Secondary | ICD-10-CM

## 2011-08-30 DIAGNOSIS — I798 Other disorders of arteries, arterioles and capillaries in diseases classified elsewhere: Secondary | ICD-10-CM

## 2011-08-30 DIAGNOSIS — I739 Peripheral vascular disease, unspecified: Secondary | ICD-10-CM

## 2011-08-30 DIAGNOSIS — E1159 Type 2 diabetes mellitus with other circulatory complications: Secondary | ICD-10-CM

## 2011-08-30 DIAGNOSIS — I10 Essential (primary) hypertension: Secondary | ICD-10-CM

## 2011-08-30 MED ORDER — RED YEAST RICE 600 MG PO CAPS: 1.0000 | ORAL_CAPSULE | Freq: Two times a day (BID) | ORAL | Status: DC

## 2011-08-30 NOTE — Patient Instructions (Addendum)
Consider the Low Glycemic Index Diet and 6 smaller meals daily :   7 AM Low carbohydrate Protein  Shakes (EAS Carb Control  Or Atkins Advantage   Available in 4 packs everywhere,   In  cases at [employer] )  2.5 carbs  (Add or substitute a toasted Arnold's Sandwhich thin w/ peanut butter)  10 AM: Protein bar by Atkins (snack size,  Chocolate lover's variety at  [employer])    Lunch: sandwich on pita bread or flatbread (Joseph's makes a pita bread and a flat bread , available at [employer] and [employer]; Toufayah makes a low carb flatbread available at [employer] and [employer]) [employer] make low carb whole wheat tortilla  3 PM:  Mid day :  Another protein bar,  Or a  cheese stick, 1/4 cup of almonds, walnuts, pistachios, pecans, peanuts,  Macadamia nuts  6 PM  Dinner:  "mean and green:"  Meat/chicken/fish,OR pinto beans, plus a  salad, and green veggie : Avoid fat Free:  use ranch, vinagrette,  Blue cheese, etc  9 PM snack : Breyer's low carb fudgiscle or  ice cream bar (Carb Smart) Weight Watcher's ice cream bar , Or Skinny Cow or another protein shake.  If you follow this diet,  You will need to suspend your glimepiride until we see how much your sugars improve   Try Red Yeast Rice  600 mg twice daily  For cholesterol.  If not,  We will try Zetia

## 2011-08-30 NOTE — Assessment & Plan Note (Signed)
LDL 229 with histor yof intoelrance ot all statins, but no prior trila of RYR or Zetai.  Trial of RYR,  reparti 6 weeks,  Samples of zetia gvien win instructions to defer starting until we try thre RYR

## 2011-08-30 NOTE — Progress Notes (Signed)
Patient ID: Janet Arnold, female   DOB: [birth year], 66 y.o.   MRN: [id number]  Patient Active Problem List  Diagnoses  . DM  . HYPERLIPIDEMIA-MIXED  . CAD, NATIVE VESSEL  . PVD  . Chest pain  . Coronary artery disease  . Diabetes mellitus with peripheral artery disease  . Hypertension    Subjective:  CC:   Chief Complaint  Patient presents with  . Follow-up    on lab results    HPI:   Janet Arnold a 66 y.o. female who presents for follow up on chronic issues including diabetes mellitus and hyperlipidemia.  Her fasting blood sugars have been between 109 .  Post prandials have been <160.  She has not had any low blood pressure .     Past Medical History  Diagnosis Date  . Hypertension   . Diabetes mellitus   . DDD (degenerative disc disease)   . Coronary artery disease     last cath 2011,  nonocclusive  . Diabetes mellitus with peripheral artery disease 2001    s/p stent to right iliac artery (?) done at [facility]     Past Surgical History  Procedure Date  . Cholecystectomy   . Partial hysterectomy   . Tonsillectomy   . Abdominal hysterectomy          The following portions of the patient's history were reviewed and updated as appropriate: Allergies, current medications, and problem list.    Review of Systems:   12 Pt  review of systems was negative except those addressed in the HPI,     History   Social History  . Marital Status: Single    Spouse Name: N/A    Number of Children: N/A  . Years of Education: N/A   Occupational History  . Not on file.   Social History Main Topics  . Smoking status: Former Smoker -- 1.5 packs/day    Types: Cigarettes    Quit date: 03/30/2010  . Smokeless tobacco: Never Used  . Alcohol Use: No  . Drug Use: No  . Sexually Active:    Other Topics Concern  . Not on file   Social History Narrative  . No narrative on file    Objective:  BP 120/64  Pulse 66  Temp(Src) 97.9 F (36.6 C) (Oral)  Resp 16   Ht 5' 3.5" (1.613 m)  Wt 161 lb 4 oz (73.143 kg)  BMI 28.12 kg/m2  SpO2 96%  General appearance: alert, cooperative and appears stated age Ears: normal TM's and external ear canals both ears Throat: lips, mucosa, and tongue normal; teeth and gums normal Neck: no adenopathy, no carotid bruit, supple, symmetrical, trachea midline and thyroid not enlarged, symmetric, no tenderness/mass/nodules Back: symmetric, no curvature. ROM normal. No CVA tenderness. Lungs: clear to auscultation bilaterally Heart: regular rate and rhythm, S1, S2 normal, no murmur, click, rub or gallop Abdomen: soft, non-tender; bowel sounds normal; no masses,  no organomegaly Pulses: 2+ and symmetric Skin: Skin color, texture, turgor normal. No rashes or lesions Lymph nodes: Cervical, supraclavicular, and axillary nodes normal.  Assessment and Plan:  Diabetes mellitus with peripheral artery disease Recent A1c was 7.5 on glimepiride and metformin.  Rather than adding more medication I have  recommended a low glycemic index diet utilizing smaller more frequent meals to increase metabolism.  I have also recommended that patient start exercising with a goal of 30 minutes of aerobic exercise a minimum of 5 days per week.   HYPERLIPIDEMIA-MIXED LDL  229 with histor yof intoelrance ot all statins, but no prior trila of RYR or Zetai.  Trial of RYR,  reparti 6 weeks,  Samples of zetia gvien win instructions to defer starting until we try thre RYR  Hypertension Well controlled on current medications.  No changes today.    Updated Medication List Outpatient Encounter Prescriptions as of 08/30/2011  Medication Sig Dispense Refill  . amLODipine (NORVASC) 10 MG tablet Take 10 mg by mouth daily.        Marland Kitchen aspirin 81 MG EC tablet Take 81 mg by mouth daily.        . DULoxetine (CYMBALTA) 60 MG capsule Take 60 mg by mouth daily.      Marland Kitchen glimepiride (AMARYL) 2 MG tablet Take 2 mg by mouth 2 (two) times daily.        . metFORMIN  (GLUMETZA) 500 MG (MOD) 24 hr tablet Take 500 mg by mouth daily with breakfast.        . metoprolol (LOPRESSOR) 100 MG tablet Take 100 mg by mouth 2 (two) times daily.        . nitroGLYCERIN (NITROSTAT) 0.4 MG SL tablet Place 1 tablet (0.4 mg total) under the tongue every 5 (five) minutes as needed.  25 tablet  6  . oxyCODONE-acetaminophen (PERCOCET) 5-325 MG per tablet Take 1 tablet by mouth every 4 (four) hours as needed.        . pantoprazole (PROTONIX) 40 MG tablet Take 40 mg by mouth daily.        . pregabalin (LYRICA) 75 MG capsule Take 75 mg by mouth 3 (three) times daily.        . Red Yeast Rice 600 MG CAPS Take 1 capsule (600 mg total) by mouth 2 (two) times daily.  60 capsule  3     No orders of the defined types were placed in this encounter.    No Follow-up on file.

## 2011-08-30 NOTE — Assessment & Plan Note (Addendum)
Recent A1c was 7.5 on glimepiride and metformin.  Rather than adding more medication I have  recommended a low glycemic index diet utilizing smaller more frequent meals to increase metabolism.  I have also recommended that patient start exercising with a goal of 30 minutes of aerobic exercise a minimum of 5 days per week.

## 2011-09-01 ENCOUNTER — Encounter: Payer: Self-pay | Admitting: Internal Medicine

## 2011-09-01 NOTE — Assessment & Plan Note (Signed)
Well controlled on current medications.  No changes today. 

## 2011-09-16 ENCOUNTER — Ambulatory Visit: Payer: Self-pay | Admitting: Vascular Surgery

## 2011-09-16 LAB — BASIC METABOLIC PANEL
Anion Gap: 6 — ABNORMAL LOW (ref 7–16)
BUN: 10 mg/dL (ref 7–18)
Calcium, Total: 8.9 mg/dL (ref 8.5–10.1)
Chloride: 106 mmol/L (ref 98–107)
EGFR (African American): >60
EGFR (Non-African Amer.): >60
Glucose: 94 mg/dL (ref 65–99)
Osmolality: 284 (ref 275–301)
Potassium: 4.6 mmol/L (ref 3.5–5.1)
Sodium: 143 mmol/L (ref 136–145)

## 2011-10-01 ENCOUNTER — Telehealth: Payer: Self-pay | Admitting: Internal Medicine

## 2011-10-01 MED ORDER — METOPROLOL TARTRATE 100 MG PO TABS: 100.0000 mg | ORAL_TABLET | Freq: Two times a day (BID) | ORAL | Status: DC

## 2011-10-01 NOTE — Telephone Encounter (Signed)
[phone] Metoprolol  tartr 100mg   2 daily zantax 0.5  2 daily [facility] Pt is complete out of meds Please advise pt when rx is called

## 2011-10-02 MED ORDER — ALPRAZOLAM 0.5 MG PO TABS: 0.5000 mg | ORAL_TABLET | Freq: Every evening | ORAL | Status: AC | PRN

## 2011-10-02 MED ORDER — METOPROLOL TARTRATE 100 MG PO TABS: 100.0000 mg | ORAL_TABLET | Freq: Two times a day (BID) | ORAL | Status: DC

## 2011-10-02 NOTE — Telephone Encounter (Signed)
Rxs called in, patient notified. 

## 2011-10-17 ENCOUNTER — Other Ambulatory Visit: Payer: Self-pay | Admitting: Internal Medicine

## 2011-10-17 MED ORDER — AMLODIPINE BESYLATE 10 MG PO TABS: 10.0000 mg | ORAL_TABLET | Freq: Every day | ORAL | Status: DC

## 2011-11-13 ENCOUNTER — Encounter: Payer: Self-pay | Admitting: Internal Medicine

## 2011-11-13 ENCOUNTER — Telehealth: Payer: Self-pay | Admitting: Internal Medicine

## 2011-11-13 ENCOUNTER — Ambulatory Visit (INDEPENDENT_AMBULATORY_CARE_PROVIDER_SITE_OTHER): Payer: Medicare Other | Admitting: Internal Medicine

## 2011-11-13 VITALS — BP 110/60 | HR 64 | Temp 98.0°F | Resp 16 | Wt 161.0 lb

## 2011-11-13 DIAGNOSIS — G629 Polyneuropathy, unspecified: Secondary | ICD-10-CM

## 2011-11-13 DIAGNOSIS — E042 Nontoxic multinodular goiter: Secondary | ICD-10-CM | POA: Insufficient documentation

## 2011-11-13 DIAGNOSIS — I798 Other disorders of arteries, arterioles and capillaries in diseases classified elsewhere: Secondary | ICD-10-CM

## 2011-11-13 DIAGNOSIS — N951 Menopausal and female climacteric states: Secondary | ICD-10-CM

## 2011-11-13 DIAGNOSIS — I739 Peripheral vascular disease, unspecified: Secondary | ICD-10-CM

## 2011-11-13 DIAGNOSIS — E1159 Type 2 diabetes mellitus with other circulatory complications: Secondary | ICD-10-CM

## 2011-11-13 DIAGNOSIS — E785 Hyperlipidemia, unspecified: Secondary | ICD-10-CM

## 2011-11-13 DIAGNOSIS — E1151 Type 2 diabetes mellitus with diabetic peripheral angiopathy without gangrene: Secondary | ICD-10-CM

## 2011-11-13 DIAGNOSIS — F32A Depression, unspecified: Secondary | ICD-10-CM

## 2011-11-13 DIAGNOSIS — F329 Major depressive disorder, single episode, unspecified: Secondary | ICD-10-CM

## 2011-11-13 MED ORDER — ESTRADIOL 1 MG PO TABS: 2.0000 mg | ORAL_TABLET | Freq: Every day | ORAL | Status: DC

## 2011-11-13 MED ORDER — VENLAFAXINE HCL 75 MG PO TABS: 75.0000 mg | ORAL_TABLET | Freq: Two times a day (BID) | ORAL | Status: DC

## 2011-11-13 NOTE — Patient Instructions (Addendum)
Start the gabapentin at 300 mg at bedtime and suspend the evening dose first.  Add the mornign dose after a few days starting with 100 mg an dstop the lyrica morning dose once you start the morning  gabapentin   We are stopping the cymbalta and starting effexor (generic) at 75 mg twice daily.  We may need to cross taper so do not change until you hear from me  I have increased the estradiol to 2 mg daily for the hot flashes  Return for fasting labs early July.  Try to increase the red yeast rice to twice daily if tolerated.   When you run out of jnauvia, you may start the [facility] but you will need to stop your metformin bc it is in the kombiglyze already

## 2011-11-13 NOTE — Progress Notes (Signed)
Patient ID: Janet Arnold, female   DOB: 09/18/1945, 66 y.o.   MRN: [id number] Patient Active Problem List  Diagnosis  . HYPERLIPIDEMIA-MIXED  . CAD, NATIVE VESSEL  . PVD  . Diabetes mellitus with peripheral artery disease  . Hypertension  . Multinodular non-toxic goiter  . Depression  . Menopausal hot flushes    Subjective:  CC:   Chief Complaint  Patient presents with  . Follow-up    3 month    HPI:   Janet OBRIEN is a 66 y.o. female who presents for follow up on diabetes, hyperlipidemia,  depression, and menospausal syndrome. She was referred to Dr. Wyn Quaker for claudication symptoms and was found to have 3 vessel PAD.  She underwent PTCA stent of 3 places, bilaterally by  Dr. Wyn Quaker  at [facility] in early May with good results. Follow up was done, and pulses were restored to her  feet. Dr Tedd Sias added Alma Friendly for better control of sugars and she has cut out some starches and walking more with good results,  But hgba1c has not been rechecked.  She is tolerating red yeast once daily but has not tried increasing it to  twice daily.  She has a history of prior statin intolerance due to myalgias.  She is taking an aspirin daily. Her Cc hot flushes, mainly night sweats, occurring every night 2 to 3 per night despite use of HRT. It is disrupting her sleep and aggravating her depression and irritability.  The depression is managed with cymbalta but it is costing her a fortune.   Past Medical History  Diagnosis Date  . Hypertension   . Diabetes mellitus   . DDD (degenerative disc disease)   . Coronary artery disease     last cath 2011,  nonocclusive  . Diabetes mellitus with peripheral artery disease 2001    s/p stent to right iliac artery (?) done at [facility]     Past Surgical History  Procedure Date  . Cholecystectomy   . Partial hysterectomy   . Tonsillectomy   . Abdominal hysterectomy          The following portions of the patient's history were reviewed and updated as appropriate:  Allergies, current medications, and problem list.    Review of Systems:  A comprehensive review of systems was negative except those addressed in the HPI,     History   Social History  . Marital Status: Single    Spouse Name: N/A    Number of Children: N/A  . Years of Education: N/A   Occupational History  . Not on file.   Social History Main Topics  . Smoking status: Former Smoker -- 1.5 packs/day    Types: Cigarettes    Quit date: 03/30/2010  . Smokeless tobacco: Never Used  . Alcohol Use: No  . Drug Use: No  . Sexually Active:    Other Topics Concern  . Not on file   Social History Narrative  . No narrative on file    Objective:  BP 110/60  Pulse 64  Temp 98 F (36.7 C) (Oral)  Resp 16  Wt 161 lb (73.029 kg)  SpO2 92%  General appearance: alert, cooperative and appears stated age Ears: normal TM's and external ear canals both ears Throat: lips, mucosa, and tongue normal; teeth and gums normal Neck: no adenopathy, no carotid bruit, supple, symmetrical, trachea midline and thyroid not enlarged, symmetric, no tenderness/mass/nodules Back: symmetric, no curvature. ROM normal. No CVA tenderness. Lungs: clear to auscultation  bilaterally Heart: regular rate and rhythm, S1, S2 normal, no murmur, click, rub or gallop Abdomen: soft, non-tender; bowel sounds normal; no masses,  no organomegaly Pulses: 2+ and symmetric Skin: Skin color, texture, turgor normal. No rashes or lesions Lymph nodes: Cervical, supraclavicular, and axillary nodes normal.  Assessment and Plan:  Depression changing from cymbalt to effexor due to cost   Diabetes mellitus with peripheral artery disease Managed with metformin, amaryl and now Januvia.  Low glycemic index diet discussed, Repeat A1c has been ordered. PAD has been addressed . Neuropathy addressed with change from Lyrica to Neurontin due to cost of medications.   PVD 3 vessel disease found and addressed with PTCA/stent May  2013.  Continue aspirin,  Increase red yeast rice to twice daily given prior intolerance to statins.   HYPERLIPIDEMIA-MIXED LDL 224,  Not tolerant of statins,  But tolerating RYR,   increase to twice daily and repeat lipids in 6 weeks  Menopausal hot flushes Recurrent despite use of HRT.  Will increase dose of estradiol to 2 mg daily.  She is aware of the risks of thrombotic events given her history of CAD and PAD , but without it her QOL will suffer.      Updated Medication List Outpatient Encounter Prescriptions as of 11/13/2011  Medication Sig Dispense Refill  . amLODipine (NORVASC) 10 MG tablet Take 1 tablet (10 mg total) by mouth daily.  30 tablet  5  . aspirin 81 MG EC tablet Take 81 mg by mouth daily.        . DULoxetine (CYMBALTA) 60 MG capsule Take 60 mg by mouth daily.      Marland Kitchen glimepiride (AMARYL) 2 MG tablet Take 2 mg by mouth 2 (two) times daily.        . metFORMIN (GLUMETZA) 500 MG (MOD) 24 hr tablet Take 500 mg by mouth daily with breakfast.        . metoprolol (LOPRESSOR) 100 MG tablet Take 1 tablet (100 mg total) by mouth 2 (two) times daily.  60 tablet  3  . nitroGLYCERIN (NITROSTAT) 0.4 MG SL tablet Place 1 tablet (0.4 mg total) under the tongue every 5 (five) minutes as needed.  25 tablet  6  . oxyCODONE-acetaminophen (PERCOCET) 5-325 MG per tablet Take 1 tablet by mouth every 4 (four) hours as needed.        . pantoprazole (PROTONIX) 40 MG tablet Take 40 mg by mouth daily.        . pregabalin (LYRICA) 75 MG capsule Take 75 mg by mouth 3 (three) times daily.        . Red Yeast Rice 600 MG CAPS Take 1 capsule (600 mg total) by mouth 2 (two) times daily.  60 capsule  3  . sitaGLIPtin (JANUVIA) 50 MG tablet Take 50 mg by mouth daily.      Marland Kitchen estradiol (ESTRACE) 1 MG tablet Take 2 tablets (2 mg total) by mouth daily.  30 tablet  5  . venlafaxine (EFFEXOR) 75 MG tablet Take 1 tablet (75 mg total) by mouth 2 (two) times daily.  60 tablet  3  . DISCONTD: estradiol (ESTRACE) 1 MG  tablet          No orders of the defined types were placed in this encounter.    No Follow-up on file.

## 2011-11-13 NOTE — Telephone Encounter (Signed)
Pt called to say she did not have any gabapentin  [facility] Please advise when call in

## 2011-11-13 NOTE — Assessment & Plan Note (Addendum)
changing from cymbalt to effexor due to cost

## 2011-11-15 MED ORDER — GABAPENTIN 100 MG PO CAPS: ORAL_CAPSULE | ORAL | Status: DC

## 2011-11-15 NOTE — Telephone Encounter (Signed)
Patient notified

## 2011-11-15 NOTE — Telephone Encounter (Signed)
Patient says that she needs a rx for gabapentin, but this is not on her med list.

## 2011-11-15 NOTE — Telephone Encounter (Signed)
New rx.  Gabapentin 100 mg tablets #[facility] .  The other change we made during visit: She does not need to cross taper when switching from cymablat to effexor.  She can take her last dose of cymbalta today and start the effexor (venlafaxine) tomorrow,  Two times  daily

## 2011-11-17 ENCOUNTER — Encounter: Payer: Self-pay | Admitting: Internal Medicine

## 2011-11-17 DIAGNOSIS — N951 Menopausal and female climacteric states: Secondary | ICD-10-CM | POA: Insufficient documentation

## 2011-11-17 NOTE — Assessment & Plan Note (Addendum)
Managed with metformin, amaryl and now Januvia.  Low glycemic index diet discussed, Repeat A1c has been ordered. PAD has been addressed . Neuropathy addressed with change from Lyrica to Neurontin due to cost of medications.

## 2011-11-17 NOTE — Assessment & Plan Note (Signed)
3 vessel disease found and addressed with PTCA/stent May 2013.  Continue aspirin,  Increase red yeast rice to twice daily given prior intolerance to statins.

## 2011-11-17 NOTE — Assessment & Plan Note (Addendum)
Recurrent despite use of HRT.  Will increase dose of estradiol to 2 mg daily.  She is aware of the risks of thrombotic events given her history of CAD and PAD , but without it her QOL will suffer.

## 2011-11-17 NOTE — Assessment & Plan Note (Signed)
LDL 224,  Not tolerant of statins,  But tolerating RYR,   increase to twice daily and repeat lipids in 6 weeks

## 2011-11-26 ENCOUNTER — Other Ambulatory Visit (INDEPENDENT_AMBULATORY_CARE_PROVIDER_SITE_OTHER): Payer: Medicare Other | Admitting: *Deleted

## 2011-11-26 DIAGNOSIS — E119 Type 2 diabetes mellitus without complications: Secondary | ICD-10-CM

## 2011-11-29 ENCOUNTER — Other Ambulatory Visit: Payer: Self-pay | Admitting: Internal Medicine

## 2011-12-10 ENCOUNTER — Telehealth: Payer: Self-pay | Admitting: Internal Medicine

## 2011-12-10 NOTE — Telephone Encounter (Signed)
Caller: Janet Arnold/Patient; PCP: Duncan Dull; CB#: [phone]; ; ; Call regarding Medication Question;  states having such major hot flashes she cannot function during the day.  Taking 2mg  estradiol but this is not helpful.  States she cannot get a haircut due to her head staying wet all day; states she has developed a rash under her breasts bilaterally as well which has become very irritated.  Night sweats have improved some since doubling her estrogen dose.  Daytime hot flashes are worse.  Per protocol, emergent symptoms denied; advised appt within 2 weeks.  Appt sched 1045 12/11/11 with Dr. Darrick Huntsman.

## 2011-12-11 ENCOUNTER — Ambulatory Visit (INDEPENDENT_AMBULATORY_CARE_PROVIDER_SITE_OTHER): Payer: Medicare Other | Admitting: Internal Medicine

## 2011-12-11 ENCOUNTER — Other Ambulatory Visit: Payer: Self-pay | Admitting: *Deleted

## 2011-12-11 VITALS — BP 108/68 | HR 61 | Temp 98.1°F | Ht 64.0 in | Wt 160.0 lb

## 2011-12-11 DIAGNOSIS — N951 Menopausal and female climacteric states: Secondary | ICD-10-CM

## 2011-12-11 MED ORDER — VENLAFAXINE HCL 75 MG PO TABS: 150.0000 mg | ORAL_TABLET | Freq: Two times a day (BID) | ORAL | Status: DC

## 2011-12-11 MED ORDER — ESTRADIOL 0.06 MG/24HR TD PTWK: 1.0000 | MEDICATED_PATCH | TRANSDERMAL | Status: DC

## 2011-12-11 NOTE — Assessment & Plan Note (Signed)
Intractable ,  despit euse of 2 mg estradiol daily.  Will change to transdermal formulation as a trial.

## 2011-12-11 NOTE — Patient Instructions (Signed)
   We are changing your estrogen therapy to a trandsdermal estrogen patch that is worn on the skin and changed every week  Place the patch on your back today.  Continue the twice daily estradiol for 48 hours,  Until you run out

## 2011-12-11 NOTE — Progress Notes (Signed)
Patient ID: Janet Arnold, female   DOB: [birth year], 66 y.o.   MRN: [id number] Patient Active Problem List  Diagnosis  . HYPERLIPIDEMIA-MIXED  . CAD, NATIVE VESSEL  . PVD  . Diabetes mellitus with peripheral artery disease  . Hypertension  . Multinodular non-toxic goiter  . Depression  . Menopausal hot flushes    Subjective:  CC:   Chief Complaint  Patient presents with  . Hot Flashes    HPI:   Janet Arnold a 66 y.o. female who presents  intractable hot flashes.  She is a 66 year old woman with persistent hot flashes secondary to menopause who has been oral hormone therapy and recently increased her dose to 2 mg of oral estradiol for multiple daily hot flashes that leave her drenched in sweat. She states that her head stays continually wet from the hot flashes to the point where she cannot style her hair. She wakes up at least twice per night as well. Symptoms have improved but are still quite disruptive.  She has no history of use of barbiturates , tranquilizers or anticonvulsants, but takes cymbalta, gabapentin and lyrica as well as percocet for management of chronic pain .  She has never tried transdermal estrogen. She denies unintentional weight loss,  postprandial flushing and diarrhea,  shortness of breath and cough.   Past Medical History  Diagnosis Date  . Hypertension   . Diabetes mellitus   . DDD (degenerative disc disease)   . Coronary artery disease     last cath 2011,  nonocclusive  . Diabetes mellitus with peripheral artery disease 2001    s/p stent to right iliac artery (?) done at [facility]     Past Surgical History  Procedure Date  . Cholecystectomy   . Partial hysterectomy   . Tonsillectomy   . Abdominal hysterectomy     The following portions of the patient's history were reviewed and updated as appropriate: Allergies, current medications, and problem list.    Review of Systems:   A comprehensive ROS was done and positive for hot flashes.   The rest  was negative.     History   Social History  . Marital Status: Single    Spouse Name: N/A    Number of Children: N/A  . Years of Education: N/A   Occupational History  . Not on file.   Social History Main Topics  . Smoking status: Former Smoker -- 1.5 packs/day    Types: Cigarettes    Quit date: 03/30/2010  . Smokeless tobacco: Never Used  . Alcohol Use: No  . Drug Use: No  . Sexually Active:    Other Topics Concern  . Not on file   Social History Narrative  . No narrative on file    Objective:  BP 108/68  Pulse 61  Temp 98.1 F (36.7 C) (Oral)  Ht 5\' 4"  (1.626 m)  Wt 160 lb (72.576 kg)  BMI 27.46 kg/m2  SpO2 97%  General appearance: alert, cooperative and appears stated age Neck: no adenopathy, no carotid bruit, supple, symmetrical, trachea midline and thyroid not enlarged, symmetric, no tenderness/mass/nodules Back: symmetric, no curvature. ROM normal. No CVA tenderness. Lungs: clear to auscultation bilaterally Heart: regular rate and rhythm, S1, S2 normal, no murmur, click, rub or gallop Abdomen: soft, non-tender; bowel sounds normal; no masses,  no organomegaly Pulses: 2+ and symmetric Skin: Skin color, texture, turgor normal. No rashes or lesions Lymph nodes: Cervical, supraclavicular, and axillary nodes normal.  Assessment and Plan:  Menopausal  hot flushes Intractable ,  Despite use of 2 mg estradiol daily.  Will change to transdermal formulation as a trial.    Updated Medication List Outpatient Encounter Prescriptions as of 12/11/2011  Medication Sig Dispense Refill  . amLODipine (NORVASC) 10 MG tablet Take 1 tablet (10 mg total) by mouth daily.  30 tablet  5  . aspirin 81 MG EC tablet Take 81 mg by mouth daily.        Marland Kitchen estradiol (ESTRACE) 1 MG tablet Take 2 tablets (2 mg total) by mouth daily.  30 tablet  5  . glimepiride (AMARYL) 2 MG tablet TAKE 1 TABLET BY MOUTH TWICE A DAY  60 tablet  11  . metFORMIN (GLUMETZA) 500 MG (MOD) 24 hr tablet  Take 500 mg by mouth daily with breakfast.        . metoprolol (LOPRESSOR) 100 MG tablet Take 1 tablet (100 mg total) by mouth 2 (two) times daily.  60 tablet  3  . nitroGLYCERIN (NITROSTAT) 0.4 MG SL tablet Place 1 tablet (0.4 mg total) under the tongue every 5 (five) minutes as needed.  25 tablet  6  . oxyCODONE-acetaminophen (PERCOCET) 5-325 MG per tablet Take 1 tablet by mouth every 4 (four) hours as needed.        . pantoprazole (PROTONIX) 40 MG tablet Take 40 mg by mouth daily.        . pregabalin (LYRICA) 75 MG capsule Take 75 mg by mouth 3 (three) times daily.        . Red Yeast Rice 600 MG CAPS Take 1 capsule (600 mg total) by mouth 2 (two) times daily.  60 capsule  3  . sitaGLIPtin (JANUVIA) 50 MG tablet Take 50 mg by mouth daily.      Marland Kitchen venlafaxine (EFFEXOR) 75 MG tablet Take 2 tablets (150 mg total) by mouth 2 (two) times daily.  120 tablet  3  . DISCONTD: DULoxetine (CYMBALTA) 60 MG capsule Take 60 mg by mouth daily.      Marland Kitchen DISCONTD: estradiol (CLIMARA) 0.06 MG/24HR Place 1 patch onto the skin once a week.  4 patch  12  . DISCONTD: gabapentin (NEURONTIN) 100 MG capsule 3 tablets at bedtime .  Add 1 tablet in the morning after one week  120 capsule  3  . DISCONTD: venlafaxine (EFFEXOR) 75 MG tablet Take 1 tablet (75 mg total) by mouth 2 (two) times daily.  60 tablet  3     No orders of the defined types were placed in this encounter.    Return in about 3 months (around 03/12/2012).

## 2011-12-14 ENCOUNTER — Encounter: Payer: Self-pay | Admitting: Internal Medicine

## 2011-12-25 ENCOUNTER — Other Ambulatory Visit: Payer: Self-pay | Admitting: Internal Medicine

## 2011-12-27 ENCOUNTER — Encounter: Payer: Self-pay | Admitting: Internal Medicine

## 2011-12-31 ENCOUNTER — Telehealth: Payer: Self-pay | Admitting: Internal Medicine

## 2011-12-31 NOTE — Telephone Encounter (Signed)
Caller: Nadelyn/Patient; PCP: Duncan Dull; CB#: [phone]; ; ; Call regarding Hot Flashes/Sweating; states seen in office 12/11/11 and started hormone patches, which she has been using as directed, one per week, but states has seen no improvement in her symptoms.  States she is unable to get her hair done, due to it's "constant wetness."  States she almost seems worse than with the oral hormone.  Per protocol, emergent symptoms denied; advised appointment within 2 weeks.  Appointment scheduled 01/01/12 1100 with Dr. Darrick Huntsman.

## 2012-01-01 ENCOUNTER — Encounter: Payer: Self-pay | Admitting: Internal Medicine

## 2012-01-01 ENCOUNTER — Ambulatory Visit (INDEPENDENT_AMBULATORY_CARE_PROVIDER_SITE_OTHER): Payer: Medicare Other | Admitting: Internal Medicine

## 2012-01-01 VITALS — BP 126/68 | HR 74 | Temp 98.5°F | Resp 14 | Wt 159.0 lb

## 2012-01-01 DIAGNOSIS — Z79899 Other long term (current) drug therapy: Secondary | ICD-10-CM

## 2012-01-01 DIAGNOSIS — N951 Menopausal and female climacteric states: Secondary | ICD-10-CM

## 2012-01-01 DIAGNOSIS — R232 Flushing: Secondary | ICD-10-CM

## 2012-01-01 MED ORDER — GLUCOSE BLOOD VI STRP: ORAL_STRIP | Status: AC

## 2012-01-01 MED ORDER — ESTRADIOL 0.1 MG/24HR TD PTWK: 1.0000 | MEDICATED_PATCH | TRANSDERMAL | Status: DC

## 2012-01-01 NOTE — Progress Notes (Signed)
Patient ID: Janet Arnold, female   DOB: 1946-04-14, 66 y.o.   MRN: [id number]  Patient Active Problem List  Diagnosis  . HYPERLIPIDEMIA-MIXED  . CAD, NATIVE VESSEL  . PVD  . Diabetes mellitus with peripheral artery disease  . Hypertension  . Multinodular non-toxic goiter  . Depression  . Adrenal abnormality  . Vasomotor flushing    Subjective:  CC:   Chief Complaint  Patient presents with  . Hot Flashes    HPI:   Janet Arnold a 66 y.o. female who presents for follow up on uncontrolled hot flashes on maximal dose of oral estrogen estrogen therapy. At last visit approximately 3 weeks ago we changed her therapy to transdermal estrogen hoping that we would give better results. She states that her hot flashes are even worse. She brace on and complete body sweats 3-4 times daily and is miserable. She has no recalled history of post prandial dumping or postprandial flushes, and no unintentional weight loss or cough, but she has had  colonoscopies in 2011 and 2012 do to multiple villous and adenomatous polyps scattered throughout the large intestine . She also had a CT scan in March of 2012 which noted a hypodense mass on only the adrenal gland on the left. Is unclear whether this has been fully evaluated.   Past Medical History  Diagnosis Date  . Hypertension   . Diabetes mellitus   . DDD (degenerative disc disease)   . Coronary artery disease     last cath 2011,  nonocclusive  . Diabetes mellitus with peripheral artery disease 2001    s/p stent to right iliac artery (?) done at [facility]   . Adrenal abnormality March 2012    indicendtal finding by review of CT report on [facility]    Past Surgical History  Procedure Date  . Cholecystectomy   . Partial hysterectomy   . Tonsillectomy   . Abdominal hysterectomy   . Angioplasty / stenting iliac April 2013    bilateral         The following portions of the patient's history were reviewed and updated as appropriate: Allergies,  current medications, and problem list.    Review of Systems: Review of Systems  Constitutional: Positive for diaphoresis.  HENT: Negative.   Eyes: Negative.   Respiratory: Negative.   Cardiovascular: Negative.   Gastrointestinal: Positive for diarrhea.  Genitourinary: Negative.   Musculoskeletal: Negative.   Skin: Negative for itching and rash.  Neurological: Negative.   Endo/Heme/Allergies: Negative.   Psychiatric/Behavioral: Negative.   The rest of a comprehensive ROS is negative except for what is listed in the HPI.     History   Social History  . Marital Status: Single    Spouse Name: N/A    Number of Children: N/A  . Years of Education: N/A   Occupational History  . Not on file.   Social History Main Topics  . Smoking status: Former Smoker -- 1.5 packs/day    Types: Cigarettes    Quit date: 03/30/2010  . Smokeless tobacco: Never Used  . Alcohol Use: No  . Drug Use: No  . Sexually Active:    Other Topics Concern  . Not on file   Social History Narrative  . No narrative on file    Objective:  BP 126/68  Pulse 74  Temp 98.5 F (36.9 C) (Oral)  Resp 14  Wt 159 lb (72.122 kg)  SpO2 97%  General appearance: alert, cooperative and appears stated age Ears: normal  TM's and external ear canals both ears Throat: lips, mucosa, and tongue normal; teeth and gums normal Neck: no adenopathy, no carotid bruit, supple, symmetrical, trachea midline and thyroid not enlarged, symmetric, no tenderness/mass/nodules Back: symmetric, no curvature. ROM normal. No CVA tenderness. Lungs: clear to auscultation bilaterally Heart: regular rate and rhythm, S1, S2 normal, no murmur, click, rub or gallop Abdomen: soft, non-tender; bowel sounds normal; no masses,  no organomegaly Pulses: 2+ and symmetric Skin: Skin color, texture, turgor normal. No rashes or lesions Lymph nodes: Cervical, supraclavicular, and axillary nodes normal.  Assessment and Plan:  Vasomotor  flushing Thus far I have treated her symptoms as due  to a menopausal syndrome. I have increased her Climara patch to maximum dose today . She has no lymphadenopathy on exam to suggest lymphoma;  However I have sent her for a PA and lateral chest x-ray which is still pending.  15 minutes reviewing all available imaging studies and colonoscopy reports available over the last several years on the hospital website for this patient, as she is relatively new to me. In light of findings on recent colonoscopies and CTs , I also need to consider the fact that she has an adrenal nodule possibly on the left which was seen on a CT done in March of 2012. Additionally she has had multiple colonoscopies in recent years for diarrhea which have revealed numerous polyps so a carcinoid syndrome also needs to be considered. I will discuss getting a 24-hour urine collection for catecholamines as well as for 5 hydroxytryptophan to rule out pheochromocytoma and  carcinoid tumor/syndrome.   Updated Medication List Outpatient Encounter Prescriptions as of 01/01/2012  Medication Sig Dispense Refill  . amLODipine (NORVASC) 10 MG tablet Take 1 tablet (10 mg total) by mouth daily.  30 tablet  5  . aspirin 81 MG EC tablet Take 81 mg by mouth daily.        Marland Kitchen estradiol (CLIMARA) 0.1 mg/24hr Place 1 patch (0.1 mg total) onto the skin once a week.  4 patch  12  . glimepiride (AMARYL) 2 MG tablet TAKE 1 TABLET BY MOUTH TWICE A DAY  60 tablet  11  . glucose blood test strip Use as instructed  100 each  12  . metFORMIN (GLUMETZA) 500 MG (MOD) 24 hr tablet Take 500 mg by mouth daily with breakfast.        . metoprolol (LOPRESSOR) 100 MG tablet Take 1 tablet (100 mg total) by mouth 2 (two) times daily.  60 tablet  3  . nitroGLYCERIN (NITROSTAT) 0.4 MG SL tablet Place 1 tablet (0.4 mg total) under the tongue every 5 (five) minutes as needed.  25 tablet  6  . oxyCODONE-acetaminophen (PERCOCET) 5-325 MG per tablet Take 1 tablet by mouth every 4  (four) hours as needed.        . pantoprazole (PROTONIX) 40 MG tablet Take 40 mg by mouth daily.        . pregabalin (LYRICA) 75 MG capsule Take 75 mg by mouth 3 (three) times daily.        Marland Kitchen PROAIR HFA 108 (90 BASE) MCG/ACT inhaler INHALE 1 TO 2 PUFFS EVERY 4 TO 6 HOURS AS NEEDED  8.5 g  3  . Red Yeast Rice 600 MG CAPS Take 1 capsule (600 mg total) by mouth 2 (two) times daily.  60 capsule  3  . sitaGLIPtin (JANUVIA) 50 MG tablet Take 50 mg by mouth daily.      Marland Kitchen venlafaxine [facility])  75 MG tablet Take 2 tablets (150 mg total) by mouth 2 (two) times daily.  120 tablet  3  . DISCONTD: estradiol (CLIMARA) 0.06 MG/24HR Place 1 patch onto the skin once a week.  4 patch  12  . DISCONTD: estradiol (ESTRACE) 1 MG tablet Take 2 tablets (2 mg total) by mouth daily.  30 tablet  5     Orders Placed This Encounter  Procedures  . DG Chest 2 View  . HM MAMMOGRAPHY  . CBC with Differential  . TSH  . 5 HIAA, quantitative, Urine, 24 hour  . Catecholamines, fractionated, urine, 24 hour [[facility]]  . HM COLONOSCOPY    No Follow-up on file.       Who are all

## 2012-01-02 ENCOUNTER — Telehealth: Payer: Self-pay | Admitting: Internal Medicine

## 2012-01-02 ENCOUNTER — Encounter: Payer: Self-pay | Admitting: Internal Medicine

## 2012-01-02 DIAGNOSIS — E279 Disorder of adrenal gland, unspecified: Secondary | ICD-10-CM | POA: Insufficient documentation

## 2012-01-02 DIAGNOSIS — R232 Flushing: Secondary | ICD-10-CM | POA: Insufficient documentation

## 2012-01-02 LAB — CBC WITH DIFFERENTIAL/PLATELET
Basophils Absolute: 0 10*3/uL (ref 0.0–0.1)
HCT: 39.3 % (ref 36.0–46.0)
Hemoglobin: 12.5 g/dL (ref 12.0–15.0)
Lymphs Abs: 1.7 10*3/uL (ref 0.7–4.0)
MCHC: 31.9 g/dL (ref 30.0–36.0)
Monocytes Relative: 5.9 % (ref 3.0–12.0)
Neutro Abs: 7.4 10*3/uL (ref 1.4–7.7)
RDW: 19.1 % — ABNORMAL HIGH (ref 11.5–14.6)

## 2012-01-02 NOTE — Assessment & Plan Note (Addendum)
Thus far I have treated her symptoms as do to a menopausal  Syndrome. I have increased her Climara patch to maximum dose. However I have sent her for a PA and lateral chest x-ray which is still pending. We also need to consider the fact that she has an adrenal nodule possibly on the left which was seen on a CT done in March of 2012. Additionally she has had multiple colonoscopies in recent years for diarrhea which have revealed numerous polyps so a carcinoid syndrome also needs to be considered. I will discuss getting a 24-hour urine collection for catecholamines as well as for 5 hydroxytrypophan

## 2012-01-02 NOTE — Telephone Encounter (Signed)
I have been thinking about her hot flushes in more detail since she left. I reviewed her last several colonoscopies in her recent CT scan that were available on [facility] and I think we also need to consider some other causes for her hot flashes other than menopause would like her to return for a 24-hour urine collection for various hormone levels that could indicate other causes liek adrenal and bowel tumors.   She can pick up the container tomorrow but she will need to get directions  from [place] on when to start it

## 2012-01-03 NOTE — Telephone Encounter (Signed)
Patient notified of message, she will come by on Monday to pick up the container.

## 2012-01-06 ENCOUNTER — Telehealth: Payer: Self-pay | Admitting: *Deleted

## 2012-01-06 NOTE — Telephone Encounter (Signed)
Opened in error

## 2012-01-14 ENCOUNTER — Other Ambulatory Visit: Payer: Self-pay | Admitting: Internal Medicine

## 2012-01-15 ENCOUNTER — Ambulatory Visit: Payer: Self-pay | Admitting: Internal Medicine

## 2012-01-15 MED ORDER — ALPRAZOLAM 0.5 MG PO TABS: 0.5000 mg | ORAL_TABLET | Freq: Every evening | ORAL | Status: AC | PRN

## 2012-01-15 NOTE — Addendum Note (Signed)
Addended by: Jobie Quaker on: 01/15/2012 05:27 PM   Modules accepted: Orders

## 2012-01-15 NOTE — Telephone Encounter (Signed)
Rx called to [facility].

## 2012-01-24 ENCOUNTER — Encounter: Payer: Self-pay | Admitting: Internal Medicine

## 2012-01-25 LAB — 5 HIAA, QUANTITATIVE, URINE, 24 HOUR: 5-HIAA, Ur: 6.5 mg/L

## 2012-01-29 ENCOUNTER — Other Ambulatory Visit: Payer: Self-pay | Admitting: Internal Medicine

## 2012-02-04 ENCOUNTER — Telehealth: Payer: Self-pay | Admitting: Internal Medicine

## 2012-02-04 MED ORDER — SAXAGLIPTIN-METFORMIN ER 2.5-1000 MG PO TB24: 1.0000 | ORAL_TABLET | Freq: Every day | ORAL | Status: DC

## 2012-02-04 NOTE — Telephone Encounter (Signed)
Patient called requesting refill on Kombiglyze XR but I do not see it anywhere in her chart.  Please advise.

## 2012-02-04 NOTE — Telephone Encounter (Signed)
It was a sample given to her. i have sent it to pharmacy

## 2012-02-04 NOTE — Telephone Encounter (Signed)
Cell [phone] Pt called she needs rx for  kombiglyze xr [facility]

## 2012-02-10 ENCOUNTER — Ambulatory Visit (INDEPENDENT_AMBULATORY_CARE_PROVIDER_SITE_OTHER): Payer: Medicare Other | Admitting: Internal Medicine

## 2012-02-10 ENCOUNTER — Encounter: Payer: Self-pay | Admitting: Internal Medicine

## 2012-02-10 VITALS — BP 128/68 | HR 88 | Temp 98.7°F | Resp 16 | Wt 156.2 lb

## 2012-02-10 DIAGNOSIS — K591 Functional diarrhea: Secondary | ICD-10-CM

## 2012-02-10 DIAGNOSIS — F329 Major depressive disorder, single episode, unspecified: Secondary | ICD-10-CM

## 2012-02-10 DIAGNOSIS — R232 Flushing: Secondary | ICD-10-CM

## 2012-02-10 DIAGNOSIS — E785 Hyperlipidemia, unspecified: Secondary | ICD-10-CM

## 2012-02-10 DIAGNOSIS — E279 Disorder of adrenal gland, unspecified: Secondary | ICD-10-CM

## 2012-02-10 DIAGNOSIS — F32A Depression, unspecified: Secondary | ICD-10-CM

## 2012-02-10 LAB — BASIC METABOLIC PANEL
Calcium: 9.6 mg/dL (ref 8.4–10.5)
GFR: 65.04 mL/min (ref >60.00)
Glucose, Bld: 96 mg/dL (ref 70–99)
Potassium: 4.7 mEq/L (ref 3.5–5.1)
Sodium: 139 mEq/L (ref 135–145)

## 2012-02-10 LAB — MAGNESIUM: Magnesium: 1.9 mg/dL (ref 1.5–2.5)

## 2012-02-10 LAB — SEDIMENTATION RATE: Sed Rate: 21 mm/hr (ref 0–22)

## 2012-02-10 NOTE — Assessment & Plan Note (Signed)
She has a 3 cm adrenal nodule on the left which has been stable by serial CTs and is under surveillance by Dr. Tedd Sias. Since 24-hour urine collection for catecholamines has been done in the past I will not repeat this now.

## 2012-02-10 NOTE — Progress Notes (Signed)
Patient ID: Janet Arnold, female   DOB: 06-30-1945, 66 y.o.   MRN: [id number]  Patient Active Problem List  Diagnosis  . HYPERLIPIDEMIA-MIXED  . CAD, NATIVE VESSEL  . PVD  . Diabetes mellitus with peripheral artery disease  . Hypertension  . Multinodular non-toxic goiter  . Depression  . Adrenal abnormality  . Vasomotor flushing  . Diarrhea, functional    Subjective:  CC:   Chief Complaint  Patient presents with  . Diarrhea    x one month    HPI:   Janet Arnold a 66 y.o. female who presents for followup on multiple issues. She has had a change in bowel habits over the past month and is now suffering from frequent stooling up to 8 formed stools daily which alternating with days of watery liquidy stools. Both types of stooling are accompanied by abdominal cramping and nausea that are relieved transiently with elimination. She has a history of irritable bowel syndrome but also has a history of polyposis requiring frequent colonoscopies. Her last one was one year ago and she scheduled for a repeat by Dr. Mechele Collin in November. (27  Polyps removed last year).  She Has lost 7 or 8 lbs unintentionally since last visit. Uses a presribed powder by Dr. Mechele Collin for frequent stools but lately has reduced use of it to 3-4 times a week because it does not seem to be helping. She's not sure of the name of it. Problem #2)  her hot flashes I have been very problematic. On prior visit we tried switching her from maximal oral dose to Climara patches, oral dose with no change in symptoms. Cymbalta was changed to Effexor. Despite these changes she is having 3-4 hot flashes per day which In her head. She is miserable  At last visit a 24-hour urine collection for 5 HIAA and urine catecholamines was ordered. However only the 5HIA8 test was resulted. It was normal. She does have a history of an adrenal abnormality on the left. The hot flashes and not accompanied by headaches or hypertension.  3) she has been  taking Kombiglyze instead of Januvia and metformin for convenience and cost savings. She follows up with Dr. Tedd Sias  for diabetes management . Her last A1c was 7.1 in July.   Past Medical History  Diagnosis Date  . Hypertension   . Diabetes mellitus   . DDD (degenerative disc disease)   . Coronary artery disease     last cath 2011,  nonocclusive  . Diabetes mellitus with peripheral artery disease 2001    s/p stent to right iliac artery (?) done at [facility]   . Adrenal abnormality March 2012    indicendtal finding by review of CT report on [facility]    Past Surgical History  Procedure Date  . Cholecystectomy   . Partial hysterectomy   . Tonsillectomy   . Abdominal hysterectomy   . Angioplasty / stenting iliac April 2013    bilateral         The following portions of the patient's history were reviewed and updated as appropriate: Allergies, current medications, and problem list.    Review of Systems:   12 Pt  review of systems was negative except those addressed in the HPI,     History   Social History  . Marital Status: Single    Spouse Name: N/A    Number of Children: N/A  . Years of Education: N/A   Occupational History  . Not on file.  Social History Main Topics  . Smoking status: Former Smoker -- 1.5 packs/day    Types: Cigarettes    Quit date: 03/30/2010  . Smokeless tobacco: Never Used  . Alcohol Use: No  . Drug Use: No  . Sexually Active:    Other Topics Concern  . Not on file   Social History Narrative  . No narrative on file    Objective:  BP 128/68  Pulse 88  Temp 98.7 F (37.1 C) (Oral)  Resp 16  Wt 156 lb 4 oz (70.875 kg)  SpO2 96%  General appearance: alert, cooperative and appears stated age Ears: normal TM's and external ear canals both ears Throat: lips, mucosa, and tongue normal; teeth and gums normal Neck: no adenopathy, no carotid bruit, supple, symmetrical, trachea midline and thyroid not enlarged, symmetric, no  tenderness/mass/nodules Back: symmetric, no curvature. ROM normal. No CVA tenderness. Lungs: clear to auscultation bilaterally Heart: regular rate and rhythm, S1, S2 normal, no murmur, click, rub or gallop Abdomen: soft, non-tender; bowel sounds normal; no masses,  no organomegaly Pulses: 2+ and symmetric Skin: Skin color, texture, turgor normal. No rashes or lesions Lymph nodes: Cervical, supraclavicular, and axillary nodes normal.  Assessment and Plan:  HYPERLIPIDEMIA-MIXED Managed with red yeast rice due to statin intolerance.  Diarrhea, functional Given her history of polyposis I'm concerned that she is having increased stooling dueto increased polyps. Carcinoid syndrome was considered and a 5 HIAA 24-hour urine collection was normal. I sent her home with stool studies including H. pylori stool antigen study as she has been treated for H. pylori in the past. I will attempt to get her colonoscopy moved up from November with Dr. Mechele Collin.  Vasomotor flushing No response to medication changes. I have no other options for control of her hot flashes. She is aware of the risks of continuing estrogen therapy in the setting of coronary artery disease and have recommended that she see one of the pharmacists at [facility] that specializes in postmenopausal hormone therapy.  Adrenal abnormality She has a 3 cm adrenal nodule on the left which has been stable by serial CTs and is under surveillance by Dr. Tedd Sias. Since 24-hour urine collection for catecholamines has been done in the past I will not repeat this now.  Depression Recent change from the Cymbalta to Effexor was done do to concurrent uncontrollable hot flashes. Her depression symptoms are controlled but her hot flashes or not.   Updated Medication List Outpatient Encounter Prescriptions as of 02/10/2012  Medication Sig Dispense Refill  . ALPRAZolam (XANAX) 0.5 MG tablet Take 1 tablet (0.5 mg total) by mouth at bedtime as needed for sleep.   30 tablet  3  . amLODipine (NORVASC) 10 MG tablet Take 1 tablet (10 mg total) by mouth daily.  30 tablet  5  . aspirin 81 MG EC tablet Take 81 mg by mouth daily.        Marland Kitchen estradiol (CLIMARA) 0.1 mg/24hr Place 1 patch (0.1 mg total) onto the skin once a week.  4 patch  12  . glucose blood test strip Use as instructed  100 each  12  . metoprolol (LOPRESSOR) 100 MG tablet TAKE 1 TABLET (100 MG TOTAL) BY MOUTH 2 (TWO) TIMES DAILY.  60 tablet  3  . nitroGLYCERIN (NITROSTAT) 0.4 MG SL tablet Place 1 tablet (0.4 mg total) under the tongue every 5 (five) minutes as needed.  25 tablet  6  . oxyCODONE-acetaminophen (PERCOCET) 5-325 MG per tablet Take 1 tablet by  mouth every 4 (four) hours as needed.        . pantoprazole (PROTONIX) 40 MG tablet Take 40 mg by mouth daily.        Marland Kitchen PROAIR HFA 108 (90 BASE) MCG/ACT inhaler INHALE 1 TO 2 PUFFS EVERY 4 TO 6 HOURS AS NEEDED  8.5 g  3  . Red Yeast Rice 600 MG CAPS Take 1 capsule (600 mg total) by mouth 2 (two) times daily.  60 capsule  3  . Saxagliptin-Metformin (KOMBIGLYZE XR) 2.09-998 MG TB24 Take 1 tablet by mouth daily after breakfast.  30 tablet  1  . venlafaxine (EFFEXOR) 75 MG tablet Take 2 tablets (150 mg total) by mouth 2 (two) times daily.  120 tablet  3  . dicyclomine (BENTYL) 10 MG capsule       . glimepiride (AMARYL) 2 MG tablet Twice daily.      . Oxycodone HCl 10 MG TABS       . DISCONTD: metFORMIN (GLUCOPHAGE) 500 MG tablet       . DISCONTD: metoprolol (LOPRESSOR) 100 MG tablet Take 1 tablet (100 mg total) by mouth 2 (two) times daily.  60 tablet  3  . DISCONTD: pregabalin (LYRICA) 75 MG capsule Take 75 mg by mouth 3 (three) times daily.           Orders Placed This Encounter  Procedures  . Magnesium  . Basic metabolic panel  . Sedimentation rate  . Ambulatory referral to Gastroenterology    No Follow-up on file.

## 2012-02-10 NOTE — Assessment & Plan Note (Signed)
Given her history of polyposis I'm concerned that she is having increased stooling dueto increased polyps. Carcinoid syndrome was considered and a 5 HIAA 24-hour urine collection was normal. I sent her home with stool studies including H. pylori stool antigen study as she has been treated for H. pylori in the past. I will attempt to get her colonoscopy moved up from November with Dr. Mechele Collin.

## 2012-02-10 NOTE — Assessment & Plan Note (Signed)
Recent change from the Cymbalta to Effexor was done do to concurrent uncontrollable hot flashes. Her depression symptoms are controlled but her hot flashes or not.

## 2012-02-10 NOTE — Assessment & Plan Note (Signed)
No response to medication changes. I have no other options for control of her hot flashes. She is aware of the risks of continuing estrogen therapy in the setting of coronary artery disease and have recommended that she see one of the pharmacists at [facility] that specializes in postmenopausal hormone therapy.

## 2012-02-10 NOTE — Patient Instructions (Addendum)
We will test your stool for infections and H Pylori and ask Dr. Mechele Collin to move up your appt. For your colonoscopy.

## 2012-02-10 NOTE — Assessment & Plan Note (Signed)
Managed with red yeast rice due to statin intolerance.  

## 2012-02-11 NOTE — Addendum Note (Signed)
Addended by: Mauri Reading on: 02/11/2012 02:05 PM   Modules accepted: Orders

## 2012-02-13 ENCOUNTER — Emergency Department: Payer: Self-pay | Admitting: Emergency Medicine

## 2012-02-13 LAB — URINALYSIS, COMPLETE
Blood: NEGATIVE
Hyaline Cast: 21
Ketone: NEGATIVE
Leukocyte Esterase: NEGATIVE
Nitrite: NEGATIVE
Ph: 5 (ref 4.5–8.0)
Protein: 30
RBC,UR: 1 /HPF (ref 0–5)
Specific Gravity: 1.025 (ref 1.003–1.030)
WBC UR: 2 /HPF (ref 0–5)

## 2012-02-13 LAB — COMPREHENSIVE METABOLIC PANEL
Albumin: 3.3 g/dL — ABNORMAL LOW (ref 3.4–5.0)
Alkaline Phosphatase: 95 U/L (ref 50–136)
BUN: 14 mg/dL (ref 7–18)
Calcium, Total: 9.3 mg/dL (ref 8.5–10.1)
Co2: 22 mmol/L (ref 21–32)
EGFR (Non-African Amer.): >60
Glucose: 107 mg/dL — ABNORMAL HIGH (ref 65–99)
Osmolality: 280 (ref 275–301)
SGOT(AST): 26 U/L (ref 15–37)
Sodium: 140 mmol/L (ref 136–145)
Total Protein: 6.9 g/dL (ref 6.4–8.2)

## 2012-02-13 LAB — LIPASE, BLOOD: Lipase: 109 U/L (ref 73–393)

## 2012-02-13 LAB — MAGNESIUM: Magnesium: 1.5 mg/dL — ABNORMAL LOW

## 2012-02-13 LAB — CBC
HGB: 13.3 g/dL (ref 12.0–16.0)
MCH: 27 pg (ref 26.0–34.0)
MCHC: 33.2 g/dL (ref 32.0–36.0)
Platelet: 423 10*3/uL (ref 150–440)

## 2012-02-15 LAB — STOOL CULTURE

## 2012-03-02 ENCOUNTER — Ambulatory Visit: Payer: Self-pay | Admitting: Unknown Physician Specialty

## 2012-03-04 ENCOUNTER — Ambulatory Visit: Payer: Self-pay | Admitting: Oncology

## 2012-03-04 ENCOUNTER — Ambulatory Visit: Payer: Self-pay | Admitting: Gynecologic Oncology

## 2012-03-04 LAB — CBC CANCER CENTER
Basophil %: 1.3 %
Eosinophil %: 2.1 %
HGB: 12.8 g/dL (ref 12.0–16.0)
MCH: 26.9 pg (ref 26.0–34.0)
Monocyte #: 0.9 x10 3/mm (ref 0.2–0.9)
Monocyte %: 9 %
Neutrophil #: 7.7 x10 3/mm — ABNORMAL HIGH (ref 1.4–6.5)
Neutrophil %: 75.7 %
RBC: 4.75 10*6/uL (ref 3.80–5.20)
WBC: 10.3 x10 3/mm (ref 3.6–11.0)

## 2012-03-04 LAB — COMPREHENSIVE METABOLIC PANEL
Alkaline Phosphatase: 87 U/L (ref 50–136)
Anion Gap: 10 (ref 7–16)
Calcium, Total: 8.9 mg/dL (ref 8.5–10.1)
Chloride: 109 mmol/L — ABNORMAL HIGH (ref 98–107)
Co2: 24 mmol/L (ref 21–32)
Glucose: 133 mg/dL — ABNORMAL HIGH (ref 65–99)
Osmolality: 290 (ref 275–301)
Potassium: 4.3 mmol/L (ref 3.5–5.1)
SGOT(AST): 29 U/L (ref 15–37)
Sodium: 143 mmol/L (ref 136–145)

## 2012-03-05 ENCOUNTER — Telehealth: Payer: Self-pay

## 2012-03-05 MED ORDER — PRAMIPEXOLE DIHYDROCHLORIDE 0.25 MG PO TABS: 0.1250 mg | ORAL_TABLET | Freq: Every day | ORAL | Status: DC

## 2012-03-05 NOTE — Telephone Encounter (Signed)
rx for mirapex called in.  Please make patient aware that I have caaled this is for her and make an appt to see me ASAP.  Please ask RN to fax her note from patient' visit. thanks

## 2012-03-05 NOTE — Telephone Encounter (Signed)
Nurse called from [place] [facility] stated that per phone conversation 03/04/12, she forgot to mention that patient has restless leg syndrome. She stated that Mirapex has been used in the past and ask if PCP can follow the patient on that and prescribe her Mirapex 0.2 mg take 1/2 tablet @[facility] .

## 2012-03-06 ENCOUNTER — Encounter: Payer: Self-pay | Admitting: *Deleted

## 2012-03-09 ENCOUNTER — Telehealth: Payer: Self-pay | Admitting: Internal Medicine

## 2012-03-09 ENCOUNTER — Telehealth: Payer: Self-pay | Admitting: Cardiovascular Disease

## 2012-03-09 ENCOUNTER — Encounter: Payer: Self-pay | Admitting: Cardiovascular Disease

## 2012-03-09 ENCOUNTER — Ambulatory Visit (INDEPENDENT_AMBULATORY_CARE_PROVIDER_SITE_OTHER): Payer: Medicare Other | Admitting: Cardiovascular Disease

## 2012-03-09 VITALS — BP 132/78 | HR 71 | Ht 65.0 in | Wt 160.5 lb

## 2012-03-09 DIAGNOSIS — R0600 Dyspnea, unspecified: Secondary | ICD-10-CM

## 2012-03-09 DIAGNOSIS — I10 Essential (primary) hypertension: Secondary | ICD-10-CM

## 2012-03-09 DIAGNOSIS — R0609 Other forms of dyspnea: Secondary | ICD-10-CM

## 2012-03-09 DIAGNOSIS — Z01810 Encounter for preprocedural cardiovascular examination: Secondary | ICD-10-CM

## 2012-03-09 NOTE — Telephone Encounter (Signed)
I checked with Janet Arnold,  Patient was set up for surgical clearance with her cardiologist Dr Kirke Corin for today.  She does not need clearance from both of [place].

## 2012-03-09 NOTE — Telephone Encounter (Signed)
Left message on cell phone voicemail advising Janet Arnold as instructed.

## 2012-03-09 NOTE — Telephone Encounter (Signed)
Representative states the patient will be having a MRI instead of CT Scan due to insurance.  This will make her run about late and they would like to know if this is ok.

## 2012-03-09 NOTE — Telephone Encounter (Signed)
Pt's daughter is calling to get a surgical clearance this week for her mother. She has Ovarian Cancer and is needing an appointment this week. Daughter states that she spoke with Erie Noe last week and she was going to ask Dr. Darrick Huntsman but i don't see a note in the chart.

## 2012-03-09 NOTE — Patient Instructions (Addendum)
Your physician has requested that you have an echocardiogram. Echocardiography is a painless test that uses sound waves to create images of your heart. It provides your doctor with information about the size and shape of your heart and how well your heart's chambers and valves are working. This procedure takes approximately one hour. There are no restrictions for this procedure.  Follow up with Dr. Mariah Milling in 6 months.

## 2012-03-09 NOTE — Telephone Encounter (Signed)
Per Dr. Kirke Corin, this is ok. I will inform Wilburn Cornelia

## 2012-03-10 ENCOUNTER — Encounter: Payer: Self-pay | Admitting: Cardiovascular Disease

## 2012-03-10 DIAGNOSIS — Z01810 Encounter for preprocedural cardiovascular examination: Secondary | ICD-10-CM | POA: Insufficient documentation

## 2012-03-10 LAB — PROTIME-INR: Prothrombin Time: 13.7 secs (ref 11.5–14.7)

## 2012-03-10 NOTE — Assessment & Plan Note (Signed)
The patient had cardiac catheterization done in 2012 which showed no evidence of obstructive coronary artery disease although there was mild to moderate left main stenosis. Ejection fraction was normal at that time. Her functional capacity is reasonable and currently she is not having symptoms suggestive of angina. Thus, I do not recommend any further ischemic cardiac evaluation. Given her increased dyspnea and the likelihood of needing chemotherapy, I recommend an echocardiogram to evaluate LV systolic function. If her ejection fraction is normal without significant valvular abnormalities, the patient can proceed with the planned surgery at an overall low risk from a cardiac standpoint.

## 2012-03-10 NOTE — Progress Notes (Signed)
HPI  [name] a 66 year old female who is here today for preoperative cardiovascular evaluation. She was recently diagnosed with an abdominal mass suspicious for ovarian cancer. She needs to have abdominal surgery done followed likely by chemotherapy. She has known history of nonobstructive CAD, admssion at [facility] for chest pain on 03/19/2010 also with history of hypertension, diabetes, hyperlipidemia, peripheral vascular disease with stents placed in the iliac artery twice. She had cardiac catheterization done in 2012 which showed mild to moderate distal left main disease, mild RCA disease estimated at 40%. Normal LV systolic function.  Overall, she denies any chest pain. She did notice increased dyspnea over the last few months without orthopnea or lower extremity edema. There has been no palpitations, syncope or presyncope. She is able to perform her activities of daily living without significant limitations. She is able to go up at least one flight of stairs.  Allergies  Allergen Reactions  . Codeine   . Dilantin (Phenytoin Sodium Extended)   . Phenytoin      Current Outpatient Prescriptions on File Prior to Visit  Medication Sig Dispense Refill  . amLODipine (NORVASC) 10 MG tablet Take 1 tablet (10 mg total) by mouth daily.  30 tablet  5  . aspirin 81 MG EC tablet Take 81 mg by mouth daily.        Marland Kitchen dicyclomine (BENTYL) 10 MG capsule       . glimepiride (AMARYL) 2 MG tablet Twice daily.      Marland Kitchen glucose blood test strip Use as instructed  100 each  12  . metoprolol (LOPRESSOR) 100 MG tablet TAKE 1 TABLET (100 MG TOTAL) BY MOUTH 2 (TWO) TIMES DAILY.  60 tablet  3  . nitroGLYCERIN (NITROSTAT) 0.4 MG SL tablet Place 1 tablet (0.4 mg total) under the tongue every 5 (five) minutes as needed.  25 tablet  6  . Oxycodone HCl 10 MG TABS as needed.       . pantoprazole (PROTONIX) 40 MG tablet Take 40 mg by mouth daily.        . pramipexole (MIRAPEX) 0.25 MG tablet Take 0.5 tablets (0.125 mg  total) by mouth at bedtime.  30 tablet  2  . PROAIR HFA 108 (90 BASE) MCG/ACT inhaler INHALE 1 TO 2 PUFFS EVERY 4 TO 6 HOURS AS NEEDED  8.5 g  3  . Red Yeast Rice 600 MG CAPS Take 1 capsule (600 mg total) by mouth 2 (two) times daily.  60 capsule  3  . Saxagliptin-Metformin (KOMBIGLYZE XR) 2.09-998 MG TB24 Take 1 tablet by mouth daily after breakfast.  30 tablet  1  . venlafaxine (EFFEXOR) 75 MG tablet Take 2 tablets (150 mg total) by mouth 2 (two) times daily.  120 tablet  3     Past Medical History  Diagnosis Date  . Hypertension   . Diabetes mellitus   . DDD (degenerative disc disease)   . Coronary artery disease     last cath 2011,  nonocclusive  . Diabetes mellitus with peripheral artery disease 2001    s/p stent to right iliac artery (?) done at [facility]   . Adrenal abnormality March 2012    indicendtal finding by review of CT report on [place]  . Ovarian cancer      Past Surgical History  Procedure Date  . Cholecystectomy   . Partial hysterectomy   . Tonsillectomy   . Abdominal hysterectomy   . Angioplasty / stenting iliac April 2013  bilateral  . Cardiac catheterization      Family History  Problem Relation Age of Onset  . Alzheimer's disease Mother   . Mental illness Mother 67    alzheimers  . Cancer Father   . Alcohol abuse Father   . COPD Father   . Heart disease Father      History   Social History  . Marital Status: Single    Spouse Name: N/A    Number of Children: N/A  . Years of Education: N/A   Occupational History  . Not on file.   Social History Main Topics  . Smoking status: Former Smoker -- 1.5 packs/day    Types: Cigarettes    Quit date: 03/30/2010  . Smokeless tobacco: Never Used  . Alcohol Use: No  . Drug Use: No  . Sexually Active:    Other Topics Concern  . Not on file   Social History Narrative  . No narrative on file     PHYSICAL EXAM   BP 132/78  Pulse 71  Ht 5\' 5"  (1.651 m)  Wt 160 lb 8 oz (72.802 kg)  BMI  26.71 kg/m2 Constitutional: She is oriented to person, place, and time. She appears well-developed and well-nourished. No distress.  HENT: No nasal discharge.  Head: Normocephalic and atraumatic.  Eyes: Pupils are equal and round. Right eye exhibits no discharge. Left eye exhibits no discharge.  Neck: Normal range of motion. Neck supple. No JVD present. No thyromegaly present.  Cardiovascular: Normal rate, regular rhythm, normal heart sounds. Exam reveals no gallop and no friction rub. No murmur heard.  Pulmonary/Chest: Effort normal and breath sounds normal. No stridor. No respiratory distress. She has no wheezes. She has no rales. She exhibits no tenderness.  Abdominal: Soft. Bowel sounds are normal. She exhibits no distension. There is no tenderness. There is no rebound and no guarding.  Musculoskeletal: Normal range of motion. She exhibits no edema and no tenderness.  Neurological: She is alert and oriented to person, place, and time. Coordination normal.  Skin: Skin is warm and dry. No rash noted. She is not diaphoretic. No erythema. No pallor.  Psychiatric: She has a normal mood and affect. Her behavior is normal. Judgment and thought content normal.     EKG: Sinus  Rhythm  -Poor R-wave progression -may be secondary to pulmonary disease   consider old anterior infarct.   -  Negative precordial T-waves.   Low voltage with rightward P-axis and rotation -possible pulmonary disease.    ASSESSMENT AND PLAN

## 2012-03-10 NOTE — Telephone Encounter (Signed)
Left message on patient vm about Rx mirapex called in to pharmacy.

## 2012-03-10 NOTE — Assessment & Plan Note (Signed)
Her blood pressure is well controlled on amlodipine and metoprolol.

## 2012-03-11 ENCOUNTER — Ambulatory Visit: Payer: Self-pay | Admitting: Oncology

## 2012-03-11 ENCOUNTER — Telehealth: Payer: Self-pay | Admitting: Internal Medicine

## 2012-03-11 NOTE — Telephone Encounter (Signed)
°  Dr. Royce Macadamia is going to be doing a total abdominal hysterectomy on Janet Arnold and is needing a note faxed over to her stating that the pt is ok to have surgery and signed at the bottom.  Fax number [phone]

## 2012-03-11 NOTE — Telephone Encounter (Signed)
On printer

## 2012-03-12 NOTE — Telephone Encounter (Signed)
Form faxed to [phone]

## 2012-03-13 LAB — CBC CANCER CENTER
Basophil %: 0.9 %
Eosinophil %: 2.4 %
HGB: 14 g/dL (ref 12.0–16.0)
Lymphocyte %: 14.4 %
MCH: 26.6 pg (ref 26.0–34.0)
MCHC: 31.9 g/dL — ABNORMAL LOW (ref 32.0–36.0)
Monocyte #: 0.8 x10 3/mm (ref 0.2–0.9)
Monocyte %: 8 %
Neutrophil %: 74.3 %
Platelet: 554 x10 3/mm — ABNORMAL HIGH (ref 150–440)
RBC: 5.28 10*6/uL — ABNORMAL HIGH (ref 3.80–5.20)
WBC: 9.5 x10 3/mm (ref 3.6–11.0)

## 2012-03-13 LAB — BASIC METABOLIC PANEL
Anion Gap: 13 (ref 7–16)
BUN: 16 mg/dL (ref 7–18)
Chloride: 103 mmol/L (ref 98–107)
Co2: 23 mmol/L (ref 21–32)
Creatinine: 1.05 mg/dL (ref 0.60–1.30)
EGFR (Non-African Amer.): 56 — ABNORMAL LOW
Glucose: 182 mg/dL — ABNORMAL HIGH (ref 65–99)
Osmolality: 283 (ref 275–301)
Potassium: 4.7 mmol/L (ref 3.5–5.1)
Sodium: 139 mmol/L (ref 136–145)

## 2012-03-16 ENCOUNTER — Other Ambulatory Visit: Payer: Self-pay

## 2012-03-16 ENCOUNTER — Other Ambulatory Visit (INDEPENDENT_AMBULATORY_CARE_PROVIDER_SITE_OTHER): Payer: Medicare Other

## 2012-03-16 DIAGNOSIS — R0602 Shortness of breath: Secondary | ICD-10-CM

## 2012-03-16 DIAGNOSIS — R0600 Dyspnea, unspecified: Secondary | ICD-10-CM

## 2012-03-17 ENCOUNTER — Ambulatory Visit: Payer: Medicare Other | Admitting: Internal Medicine

## 2012-03-17 ENCOUNTER — Ambulatory Visit: Payer: Self-pay | Admitting: Unknown Physician Specialty

## 2012-03-18 LAB — PATHOLOGY REPORT

## 2012-03-19 ENCOUNTER — Ambulatory Visit: Payer: Self-pay | Admitting: Obstetrics & Gynecology

## 2012-03-19 LAB — CBC
HGB: 12.4 g/dL (ref 12.0–16.0)
MCHC: 33.2 g/dL (ref 32.0–36.0)
RBC: 4.56 10*6/uL (ref 3.80–5.20)

## 2012-03-19 LAB — BASIC METABOLIC PANEL
Anion Gap: 8 (ref 7–16)
BUN: 12 mg/dL (ref 7–18)
Calcium, Total: 8.6 mg/dL (ref 8.5–10.1)
Co2: 25 mmol/L (ref 21–32)
Creatinine: 1.04 mg/dL (ref 0.60–1.30)
EGFR (African American): >60
EGFR (Non-African Amer.): 56 — ABNORMAL LOW
Glucose: 99 mg/dL (ref 65–99)
Potassium: 4.5 mmol/L (ref 3.5–5.1)
Sodium: 141 mmol/L (ref 136–145)

## 2012-03-19 LAB — APTT: Activated PTT: 27.2 secs (ref 23.6–35.9)

## 2012-03-24 ENCOUNTER — Inpatient Hospital Stay: Payer: Self-pay | Admitting: Obstetrics & Gynecology

## 2012-03-25 LAB — BASIC METABOLIC PANEL
Anion Gap: 9 (ref 7–16)
Calcium, Total: 7.3 mg/dL — ABNORMAL LOW (ref 8.5–10.1)
Chloride: 108 mmol/L — ABNORMAL HIGH (ref 98–107)
Co2: 21 mmol/L (ref 21–32)
EGFR (Non-African Amer.): 44 — ABNORMAL LOW
Glucose: 249 mg/dL — ABNORMAL HIGH (ref 65–99)
Osmolality: 284 (ref 275–301)
Potassium: 5.3 mmol/L — ABNORMAL HIGH (ref 3.5–5.1)
Sodium: 138 mmol/L (ref 136–145)

## 2012-03-25 LAB — CBC WITH DIFFERENTIAL/PLATELET
Basophil %: 0.3 %
Eosinophil #: 0 10*3/uL (ref 0.0–0.7)
HGB: 10.5 g/dL — ABNORMAL LOW (ref 12.0–16.0)
Lymphocyte %: 4.4 %
MCHC: 32.3 g/dL (ref 32.0–36.0)
Monocyte %: 7.8 %
Neutrophil %: 87.5 %
RBC: 3.96 10*6/uL (ref 3.80–5.20)
RDW: 17.3 % — ABNORMAL HIGH (ref 11.5–14.5)
WBC: 16.6 10*3/uL — ABNORMAL HIGH (ref 3.6–11.0)

## 2012-03-25 LAB — MAGNESIUM: Magnesium: 1.6 mg/dL — ABNORMAL LOW

## 2012-03-25 LAB — POTASSIUM: Potassium: 4.7 mmol/L (ref 3.5–5.1)

## 2012-03-26 LAB — BASIC METABOLIC PANEL
Anion Gap: 11 (ref 7–16)
Calcium, Total: 7.3 mg/dL — ABNORMAL LOW (ref 8.5–10.1)
Chloride: 108 mmol/L — ABNORMAL HIGH (ref 98–107)
Co2: 19 mmol/L — ABNORMAL LOW (ref 21–32)
Creatinine: 0.75 mg/dL (ref 0.60–1.30)
EGFR (African American): >60
EGFR (Non-African Amer.): >60
Osmolality: 276 (ref 275–301)
Sodium: 138 mmol/L (ref 136–145)

## 2012-03-27 ENCOUNTER — Ambulatory Visit: Payer: Self-pay | Admitting: Gynecologic Oncology

## 2012-03-27 LAB — HEMOGLOBIN: HGB: 7.8 g/dL — ABNORMAL LOW (ref 12.0–16.0)

## 2012-03-28 LAB — CLOSTRIDIUM DIFFICILE BY PCR

## 2012-03-29 LAB — BASIC METABOLIC PANEL
Anion Gap: 8 (ref 7–16)
Chloride: 109 mmol/L — ABNORMAL HIGH (ref 98–107)
Co2: 24 mmol/L (ref 21–32)
Creatinine: 0.59 mg/dL — ABNORMAL LOW (ref 0.60–1.30)
EGFR (African American): >60
EGFR (Non-African Amer.): >60
Potassium: 3.5 mmol/L (ref 3.5–5.1)

## 2012-03-30 ENCOUNTER — Other Ambulatory Visit: Payer: Self-pay | Admitting: Internal Medicine

## 2012-04-07 ENCOUNTER — Ambulatory Visit: Payer: Self-pay | Admitting: Gynecologic Oncology

## 2012-04-07 LAB — PLATELET COUNT: Platelet: 725 10*3/uL — ABNORMAL HIGH (ref 150–440)

## 2012-04-07 LAB — PROTIME-INR
INR: 0.9
Prothrombin Time: 13 secs (ref 11.5–14.7)

## 2012-04-09 LAB — COMPREHENSIVE METABOLIC PANEL
Albumin: 2.4 g/dL — ABNORMAL LOW (ref 3.4–5.0)
Anion Gap: 11 (ref 7–16)
BUN: 12 mg/dL (ref 7–18)
Calcium, Total: 8.7 mg/dL (ref 8.5–10.1)
Creatinine: 0.92 mg/dL (ref 0.60–1.30)
EGFR (African American): >60
EGFR (Non-African Amer.): >60
Glucose: 120 mg/dL — ABNORMAL HIGH (ref 65–99)
Osmolality: 275 (ref 275–301)
SGOT(AST): 19 U/L (ref 15–37)
SGPT (ALT): 25 U/L (ref 12–78)
Sodium: 137 mmol/L (ref 136–145)
Total Protein: 6.2 g/dL — ABNORMAL LOW (ref 6.4–8.2)

## 2012-04-09 LAB — CBC CANCER CENTER
Basophil #: 0 x10 3/mm (ref 0.0–0.1)
Basophil %: 0.2 %
Eosinophil #: 0 x10 3/mm (ref 0.0–0.7)
Eosinophil %: 0.3 %
HCT: 30.7 % — ABNORMAL LOW (ref 35.0–47.0)
Lymphocyte #: 0.7 x10 3/mm — ABNORMAL LOW (ref 1.0–3.6)
Lymphocyte %: 8.1 %
MCH: 26.1 pg (ref 26.0–34.0)
MCHC: 31.7 g/dL — ABNORMAL LOW (ref 32.0–36.0)
MCV: 82 fL (ref 80–100)
Monocyte %: 4.4 %
Neutrophil #: 7.2 x10 3/mm — ABNORMAL HIGH (ref 1.4–6.5)
RBC: 3.73 10*6/uL — ABNORMAL LOW (ref 3.80–5.20)
RDW: 17.6 % — ABNORMAL HIGH (ref 11.5–14.5)

## 2012-04-15 LAB — COMPREHENSIVE METABOLIC PANEL
Albumin: 2.8 g/dL — ABNORMAL LOW (ref 3.4–5.0)
BUN: 11 mg/dL (ref 7–18)
Bilirubin,Total: 0.2 mg/dL (ref 0.2–1.0)
Chloride: 99 mmol/L (ref 98–107)
Creatinine: 0.87 mg/dL (ref 0.60–1.30)
EGFR (African American): >60
Potassium: 4.4 mmol/L (ref 3.5–5.1)
SGOT(AST): 16 U/L (ref 15–37)
SGPT (ALT): 19 U/L (ref 12–78)
Total Protein: 6.6 g/dL (ref 6.4–8.2)

## 2012-04-15 LAB — CBC CANCER CENTER
Basophil %: 0.8 %
Eosinophil #: 0.1 x10 3/mm (ref 0.0–0.7)
Eosinophil %: 0.6 %
HCT: 34.7 % — ABNORMAL LOW (ref 35.0–47.0)
Lymphocyte %: 16.9 %
MCV: 81 fL (ref 80–100)
Monocyte #: 0.9 x10 3/mm (ref 0.2–0.9)
Monocyte %: 8.4 %
Neutrophil %: 73.3 %
Platelet: 522 x10 3/mm — ABNORMAL HIGH (ref 150–440)
RBC: 4.31 10*6/uL (ref 3.80–5.20)
WBC: 10.2 x10 3/mm (ref 3.6–11.0)

## 2012-04-15 LAB — PHOSPHORUS: Phosphorus: 3 mg/dL (ref 2.5–4.9)

## 2012-04-15 LAB — MAGNESIUM: Magnesium: 1.8 mg/dL

## 2012-04-16 ENCOUNTER — Encounter: Payer: Self-pay | Admitting: Internal Medicine

## 2012-04-20 ENCOUNTER — Ambulatory Visit: Payer: Self-pay | Admitting: Vascular Surgery

## 2012-04-21 LAB — COMPREHENSIVE METABOLIC PANEL
Albumin: 2.9 g/dL — ABNORMAL LOW (ref 3.4–5.0)
Alkaline Phosphatase: 157 U/L — ABNORMAL HIGH (ref 50–136)
Anion Gap: 14 (ref 7–16)
BUN: 15 mg/dL (ref 7–18)
Bilirubin,Total: 0.2 mg/dL (ref 0.2–1.0)
Co2: 24 mmol/L (ref 21–32)
Creatinine: 0.94 mg/dL (ref 0.60–1.30)
EGFR (African American): >60
EGFR (Non-African Amer.): >60
Glucose: 176 mg/dL — ABNORMAL HIGH (ref 65–99)
Osmolality: 281 (ref 275–301)
Potassium: 4.3 mmol/L (ref 3.5–5.1)
SGPT (ALT): 27 U/L (ref 12–78)
Sodium: 138 mmol/L (ref 136–145)
Total Protein: 6.6 g/dL (ref 6.4–8.2)

## 2012-04-21 LAB — CBC CANCER CENTER
Basophil #: 0.1 x10 3/mm (ref 0.0–0.1)
Eosinophil %: 1 %
Lymphocyte #: 1.6 x10 3/mm (ref 1.0–3.6)
MCV: 80 fL (ref 80–100)
Monocyte #: 0.8 x10 3/mm (ref 0.2–0.9)
Monocyte %: 8.7 %
Neutrophil #: 6.7 x10 3/mm — ABNORMAL HIGH (ref 1.4–6.5)
Neutrophil %: 72.1 %
Platelet: 484 x10 3/mm — ABNORMAL HIGH (ref 150–440)
RBC: 4.39 10*6/uL (ref 3.80–5.20)
RDW: 17.5 % — ABNORMAL HIGH (ref 11.5–14.5)
WBC: 9.3 x10 3/mm (ref 3.6–11.0)

## 2012-04-22 ENCOUNTER — Telehealth: Payer: Self-pay | Admitting: Internal Medicine

## 2012-04-22 MED ORDER — AMLODIPINE BESYLATE 10 MG PO TABS: 10.0000 mg | ORAL_TABLET | Freq: Every day | ORAL | Status: DC

## 2012-04-22 NOTE — Telephone Encounter (Signed)
Amlodipine 10 mg # 30 5 R sent electronic to [facility]

## 2012-04-22 NOTE — Telephone Encounter (Signed)
Refill request for amlodpipine besylate 10 mg tab Qty: 30 Sig: take 1 tablet by mouth daily

## 2012-04-25 ENCOUNTER — Other Ambulatory Visit: Payer: Self-pay | Admitting: Internal Medicine

## 2012-04-26 ENCOUNTER — Ambulatory Visit: Payer: Self-pay | Admitting: Gynecologic Oncology

## 2012-04-26 ENCOUNTER — Other Ambulatory Visit: Payer: Self-pay | Admitting: Internal Medicine

## 2012-04-28 LAB — CBC CANCER CENTER
Basophil #: 0.3 x10 3/mm — ABNORMAL HIGH (ref 0.0–0.1)
Basophil %: 0.9 %
Lymphocyte #: 3 x10 3/mm (ref 1.0–3.6)
Lymphocyte %: 8.3 %
MCH: 25.1 pg — ABNORMAL LOW (ref 26.0–34.0)
Monocyte %: 5.6 %
Neutrophil %: 83.9 %
Platelet: 379 x10 3/mm (ref 150–440)
RDW: 17.9 % — ABNORMAL HIGH (ref 11.5–14.5)
WBC: 36.1 x10 3/mm — ABNORMAL HIGH (ref 3.6–11.0)

## 2012-04-28 LAB — PROTIME-INR
INR: 1.5
Prothrombin Time: 18.4 secs — ABNORMAL HIGH (ref 11.5–14.7)

## 2012-05-02 ENCOUNTER — Emergency Department: Payer: Self-pay | Admitting: Internal Medicine

## 2012-05-02 LAB — CBC
HCT: 28.3 % — ABNORMAL LOW (ref 35.0–47.0)
HGB: 9.3 g/dL — ABNORMAL LOW (ref 12.0–16.0)
MCH: 26 pg (ref 26.0–34.0)
MCHC: 33 g/dL (ref 32.0–36.0)
Platelet: 348 10*3/uL (ref 150–440)
RBC: 3.58 10*6/uL — ABNORMAL LOW (ref 3.80–5.20)
RDW: 18 % — ABNORMAL HIGH (ref 11.5–14.5)

## 2012-05-02 LAB — COMPREHENSIVE METABOLIC PANEL
BUN: 11 mg/dL (ref 7–18)
Calcium, Total: 8 mg/dL — ABNORMAL LOW (ref 8.5–10.1)
Chloride: 103 mmol/L (ref 98–107)
Co2: 26 mmol/L (ref 21–32)
EGFR (African American): >60
EGFR (Non-African Amer.): >60
SGOT(AST): 30 U/L (ref 15–37)
SGPT (ALT): 28 U/L (ref 12–78)

## 2012-05-02 LAB — URINALYSIS, COMPLETE
Bilirubin,UR: NEGATIVE
Ketone: NEGATIVE
Ph: 5 (ref 4.5–8.0)
RBC,UR: 21 /HPF (ref 0–5)
Transitional Epi: <1
WBC UR: 4 /HPF (ref 0–5)

## 2012-05-02 LAB — TROPONIN I: Troponin-I: 0.02 ng/mL

## 2012-05-02 LAB — LIPASE, BLOOD: Lipase: 172 U/L (ref 73–393)

## 2012-05-05 LAB — CBC CANCER CENTER
Basophil #: 0.2 x10 3/mm — ABNORMAL HIGH (ref 0.0–0.1)
Basophil %: 0.9 %
Eosinophil %: 0.6 %
HCT: 30.7 % — ABNORMAL LOW (ref 35.0–47.0)
Lymphocyte #: 1.9 x10 3/mm (ref 1.0–3.6)
MCHC: 32.1 g/dL (ref 32.0–36.0)
MCV: 79 fL — ABNORMAL LOW (ref 80–100)
Monocyte #: 1 x10 3/mm — ABNORMAL HIGH (ref 0.2–0.9)
Monocyte %: 4.9 %
Neutrophil %: 84.7 %
RDW: 18 % — ABNORMAL HIGH (ref 11.5–14.5)

## 2012-05-05 LAB — PROTIME-INR
INR: 1.7
Prothrombin Time: 20.4 secs — ABNORMAL HIGH (ref 11.5–14.7)

## 2012-05-08 LAB — CULTURE, BLOOD (SINGLE)

## 2012-05-12 LAB — CBC CANCER CENTER
Basophil #: 0.1 x10 3/mm (ref 0.0–0.1)
Eosinophil %: 0.4 %
HCT: 32 % — ABNORMAL LOW (ref 35.0–47.0)
HGB: 10.5 g/dL — ABNORMAL LOW (ref 12.0–16.0)
Lymphocyte #: 2.5 x10 3/mm (ref 1.0–3.6)
Lymphocyte %: 19.3 %
MCH: 25.5 pg — ABNORMAL LOW (ref 26.0–34.0)
Monocyte %: 7.5 %
Neutrophil %: 72.3 %
RBC: 4.1 10*6/uL (ref 3.80–5.20)
WBC: 12.8 x10 3/mm — ABNORMAL HIGH (ref 3.6–11.0)

## 2012-05-12 LAB — COMPREHENSIVE METABOLIC PANEL
Albumin: 3.2 g/dL — ABNORMAL LOW (ref 3.4–5.0)
Alkaline Phosphatase: 147 U/L — ABNORMAL HIGH (ref 50–136)
BUN: 21 mg/dL — ABNORMAL HIGH (ref 7–18)
Calcium, Total: 8.2 mg/dL — ABNORMAL LOW (ref 8.5–10.1)
Chloride: 101 mmol/L (ref 98–107)
Co2: 25 mmol/L (ref 21–32)
EGFR (Non-African Amer.): 59 — ABNORMAL LOW
Glucose: 123 mg/dL — ABNORMAL HIGH (ref 65–99)
Osmolality: 280 (ref 275–301)
SGOT(AST): 55 U/L — ABNORMAL HIGH (ref 15–37)
SGPT (ALT): 80 U/L — ABNORMAL HIGH (ref 12–78)
Sodium: 138 mmol/L (ref 136–145)

## 2012-05-12 LAB — PROTIME-INR
INR: 1.1
Prothrombin Time: 15 secs — ABNORMAL HIGH (ref 11.5–14.7)

## 2012-05-13 LAB — CLOSTRIDIUM DIFFICILE BY PCR

## 2012-05-18 LAB — COMPREHENSIVE METABOLIC PANEL
Albumin: 3.5 g/dL (ref 3.4–5.0)
Alkaline Phosphatase: 258 U/L — ABNORMAL HIGH (ref 50–136)
Anion Gap: 12 (ref 7–16)
BUN: 19 mg/dL — ABNORMAL HIGH (ref 7–18)
Bilirubin,Total: 0.1 mg/dL — ABNORMAL LOW (ref 0.2–1.0)
Calcium, Total: 9.1 mg/dL (ref 8.5–10.1)
Chloride: 101 mmol/L (ref 98–107)
Co2: 25 mmol/L (ref 21–32)
Creatinine: 0.94 mg/dL (ref 0.60–1.30)
EGFR (African American): >60
Glucose: 125 mg/dL — ABNORMAL HIGH (ref 65–99)
Osmolality: 279 (ref 275–301)
Potassium: 4.6 mmol/L (ref 3.5–5.1)
SGOT(AST): 31 U/L (ref 15–37)
SGPT (ALT): 46 U/L (ref 12–78)
Sodium: 138 mmol/L (ref 136–145)

## 2012-05-18 LAB — CBC CANCER CENTER
Basophil #: 0.1 x10 3/mm (ref 0.0–0.1)
Eosinophil #: 0 x10 3/mm (ref 0.0–0.7)
Eosinophil %: 0.1 %
HCT: 28.6 % — ABNORMAL LOW (ref 35.0–47.0)
HGB: 9.1 g/dL — ABNORMAL LOW (ref 12.0–16.0)
Lymphocyte #: 1.9 x10 3/mm (ref 1.0–3.6)
Lymphocyte %: 4.9 %
MCHC: 31.7 g/dL — ABNORMAL LOW (ref 32.0–36.0)
Monocyte #: 2 x10 3/mm — ABNORMAL HIGH (ref 0.2–0.9)
RBC: 3.69 10*6/uL — ABNORMAL LOW (ref 3.80–5.20)
RDW: 18.1 % — ABNORMAL HIGH (ref 11.5–14.5)
WBC: 38.8 x10 3/mm — ABNORMAL HIGH (ref 3.6–11.0)

## 2012-05-18 LAB — PROTIME-INR: Prothrombin Time: 24 secs — ABNORMAL HIGH (ref 11.5–14.7)

## 2012-05-23 ENCOUNTER — Other Ambulatory Visit: Payer: Self-pay | Admitting: Internal Medicine

## 2012-05-25 ENCOUNTER — Inpatient Hospital Stay: Payer: Self-pay | Admitting: Internal Medicine

## 2012-05-25 LAB — CBC WITH DIFFERENTIAL/PLATELET
Eosinophil %: 0 %
HCT: 28 % — ABNORMAL LOW (ref 35.0–47.0)
Lymphocyte %: 10.6 %
MCH: 24.6 pg — ABNORMAL LOW (ref 26.0–34.0)
MCV: 78 fL — ABNORMAL LOW (ref 80–100)
Monocyte %: 3.3 %
Neutrophil %: 85.9 %
Platelet: 260 10*3/uL (ref 150–440)
RBC: 3.61 10*6/uL — ABNORMAL LOW (ref 3.80–5.20)
RDW: 18.7 % — ABNORMAL HIGH (ref 11.5–14.5)
WBC: 24.4 10*3/uL — ABNORMAL HIGH (ref 3.6–11.0)

## 2012-05-25 LAB — BASIC METABOLIC PANEL
Anion Gap: 8 (ref 7–16)
Calcium, Total: 8.4 mg/dL — ABNORMAL LOW (ref 8.5–10.1)
Chloride: 96 mmol/L — ABNORMAL LOW (ref 98–107)
Co2: 27 mmol/L (ref 21–32)
Creatinine: 0.85 mg/dL (ref 0.60–1.30)
EGFR (African American): >60
Osmolality: 265 (ref 275–301)
Potassium: 5.1 mmol/L (ref 3.5–5.1)

## 2012-05-26 LAB — BASIC METABOLIC PANEL
Anion Gap: 6 — ABNORMAL LOW (ref 7–16)
BUN: 9 mg/dL (ref 7–18)
Chloride: 104 mmol/L (ref 98–107)
Creatinine: 0.65 mg/dL (ref 0.60–1.30)
Glucose: 102 mg/dL — ABNORMAL HIGH (ref 65–99)
Potassium: 4.3 mmol/L (ref 3.5–5.1)

## 2012-05-26 LAB — CBC WITH DIFFERENTIAL/PLATELET
Basophil %: 0.2 %
Eosinophil #: 0 10*3/uL (ref 0.0–0.7)
Eosinophil %: 0 %
HCT: 28.5 % — ABNORMAL LOW (ref 35.0–47.0)
HGB: 9 g/dL — ABNORMAL LOW (ref 12.0–16.0)
Lymphocyte %: 9.9 %
MCHC: 31.6 g/dL — ABNORMAL LOW (ref 32.0–36.0)
Monocyte %: 4.5 %
Neutrophil #: 11.3 10*3/uL — ABNORMAL HIGH (ref 1.4–6.5)
Neutrophil %: 85.4 %
RBC: 3.67 10*6/uL — ABNORMAL LOW (ref 3.80–5.20)
RDW: 18.9 % — ABNORMAL HIGH (ref 11.5–14.5)

## 2012-05-26 LAB — PROTIME-INR
INR: 4.1
Prothrombin Time: 39.9 secs — ABNORMAL HIGH (ref 11.5–14.7)

## 2012-05-27 ENCOUNTER — Ambulatory Visit: Payer: Self-pay | Admitting: Gynecologic Oncology

## 2012-05-27 LAB — BASIC METABOLIC PANEL
Anion Gap: 9 (ref 7–16)
BUN: 4 mg/dL — ABNORMAL LOW (ref 7–18)
Chloride: 108 mmol/L — ABNORMAL HIGH (ref 98–107)
Co2: 25 mmol/L (ref 21–32)
Creatinine: 0.6 mg/dL (ref 0.60–1.30)
Potassium: 3.9 mmol/L (ref 3.5–5.1)
Sodium: 142 mmol/L (ref 136–145)

## 2012-05-27 LAB — PROTIME-INR: INR: 3.2

## 2012-05-29 LAB — BASIC METABOLIC PANEL
Anion Gap: 8 (ref 7–16)
BUN: 10 mg/dL (ref 7–18)
Calcium, Total: 8.7 mg/dL (ref 8.5–10.1)
Chloride: 100 mmol/L (ref 98–107)
Co2: 30 mmol/L (ref 21–32)
EGFR (African American): >60
EGFR (Non-African Amer.): >60
Osmolality: 275 (ref 275–301)
Potassium: 4.3 mmol/L (ref 3.5–5.1)
Sodium: 138 mmol/L (ref 136–145)

## 2012-05-29 LAB — CBC CANCER CENTER
Basophil #: 0.1 x10 3/mm (ref 0.0–0.1)
Basophil %: 0.7 %
Eosinophil #: 0 x10 3/mm (ref 0.0–0.7)
Eosinophil %: 0 %
HCT: 26.3 % — ABNORMAL LOW (ref 35.0–47.0)
Lymphocyte #: 1.9 x10 3/mm (ref 1.0–3.6)
Monocyte #: 0.8 x10 3/mm (ref 0.2–0.9)
Neutrophil %: 78.5 %
Platelet: 266 x10 3/mm (ref 150–440)
RBC: 3.43 10*6/uL — ABNORMAL LOW (ref 3.80–5.20)
RDW: 19.1 % — ABNORMAL HIGH (ref 11.5–14.5)
WBC: 12.7 x10 3/mm — ABNORMAL HIGH (ref 3.6–11.0)

## 2012-05-29 LAB — PROTIME-INR: INR: 1.3

## 2012-06-02 LAB — COMPREHENSIVE METABOLIC PANEL
Albumin: 2.8 g/dL — ABNORMAL LOW (ref 3.4–5.0)
Anion Gap: 12 (ref 7–16)
BUN: 13 mg/dL (ref 7–18)
Bilirubin,Total: 0.2 mg/dL (ref 0.2–1.0)
Calcium, Total: 8.2 mg/dL — ABNORMAL LOW (ref 8.5–10.1)
Chloride: 104 mmol/L (ref 98–107)
Co2: 22 mmol/L (ref 21–32)
Creatinine: 0.95 mg/dL (ref 0.60–1.30)
EGFR (Non-African Amer.): >60
SGOT(AST): 15 U/L (ref 15–37)

## 2012-06-02 LAB — CBC CANCER CENTER
Basophil #: 0.1 x10 3/mm (ref 0.0–0.1)
Basophil %: 0.7 %
Eosinophil #: 0 x10 3/mm (ref 0.0–0.7)
Eosinophil %: 0.1 %
HGB: 8.4 g/dL — ABNORMAL LOW (ref 12.0–16.0)
MCV: 78 fL — ABNORMAL LOW (ref 80–100)
Monocyte #: 0.8 x10 3/mm (ref 0.2–0.9)
Monocyte %: 7.6 %
Neutrophil %: 78.9 %
Platelet: 228 x10 3/mm (ref 150–440)
RBC: 3.36 10*6/uL — ABNORMAL LOW (ref 3.80–5.20)
WBC: 10.2 x10 3/mm (ref 3.6–11.0)

## 2012-06-03 LAB — CA 125: CA 125: 36.7 U/mL — ABNORMAL HIGH (ref 0.0–34.0)

## 2012-06-09 LAB — COMPREHENSIVE METABOLIC PANEL
Albumin: 2.7 g/dL — ABNORMAL LOW (ref 3.4–5.0)
Alkaline Phosphatase: 143 U/L — ABNORMAL HIGH (ref 50–136)
Bilirubin,Total: <0.1 mg/dL — ABNORMAL LOW (ref 0.2–1.0)
Chloride: 103 mmol/L (ref 98–107)
Co2: 23 mmol/L (ref 21–32)
Creatinine: 0.67 mg/dL (ref 0.60–1.30)
EGFR (Non-African Amer.): >60
Osmolality: 276 (ref 275–301)
Potassium: 3.7 mmol/L (ref 3.5–5.1)
SGOT(AST): 16 U/L (ref 15–37)
SGPT (ALT): 27 U/L (ref 12–78)
Sodium: 136 mmol/L (ref 136–145)
Total Protein: 6.2 g/dL — ABNORMAL LOW (ref 6.4–8.2)

## 2012-06-09 LAB — CBC CANCER CENTER
Basophil #: 0.1 x10 3/mm (ref 0.0–0.1)
Basophil %: 1.5 %
Eosinophil #: 0 x10 3/mm (ref 0.0–0.7)
HCT: 26.5 % — ABNORMAL LOW (ref 35.0–47.0)
MCH: 24.1 pg — ABNORMAL LOW (ref 26.0–34.0)
MCV: 77 fL — ABNORMAL LOW (ref 80–100)
RBC: 3.44 10*6/uL — ABNORMAL LOW (ref 3.80–5.20)
RDW: 19.5 % — ABNORMAL HIGH (ref 11.5–14.5)

## 2012-06-15 LAB — COMPREHENSIVE METABOLIC PANEL
Anion Gap: 10 (ref 7–16)
Calcium, Total: 8.7 mg/dL (ref 8.5–10.1)
Chloride: 101 mmol/L (ref 98–107)
Co2: 26 mmol/L (ref 21–32)
EGFR (Non-African Amer.): >60
Glucose: 108 mg/dL — ABNORMAL HIGH (ref 65–99)
Osmolality: 275 (ref 275–301)
Potassium: 4.6 mmol/L (ref 3.5–5.1)
SGPT (ALT): 36 U/L (ref 12–78)
Sodium: 137 mmol/L (ref 136–145)
Total Protein: 6.8 g/dL (ref 6.4–8.2)

## 2012-06-15 LAB — CBC CANCER CENTER
Basophil #: 0 x10 3/mm (ref 0.0–0.1)
HCT: 26 % — ABNORMAL LOW (ref 35.0–47.0)
Lymphocyte #: 1.5 x10 3/mm (ref 1.0–3.6)
Lymphocyte %: 39 %
MCHC: 32 g/dL (ref 32.0–36.0)
MCV: 78 fL — ABNORMAL LOW (ref 80–100)
Monocyte %: 6.1 %
Neutrophil #: 2.1 x10 3/mm (ref 1.4–6.5)
Platelet: 109 x10 3/mm — ABNORMAL LOW (ref 150–440)
RBC: 3.35 10*6/uL — ABNORMAL LOW (ref 3.80–5.20)
RDW: 19.2 % — ABNORMAL HIGH (ref 11.5–14.5)
WBC: 3.9 x10 3/mm (ref 3.6–11.0)

## 2012-06-15 LAB — PROTIME-INR: INR: 1.5

## 2012-06-17 LAB — CLOSTRIDIUM DIFFICILE BY PCR

## 2012-06-23 LAB — COMPREHENSIVE METABOLIC PANEL
Alkaline Phosphatase: 164 U/L — ABNORMAL HIGH (ref 50–136)
BUN: 17 mg/dL (ref 7–18)
Bilirubin,Total: 0.1 mg/dL — ABNORMAL LOW (ref 0.2–1.0)
Calcium, Total: 8.5 mg/dL (ref 8.5–10.1)
Chloride: 99 mmol/L (ref 98–107)
EGFR (Non-African Amer.): 40 — ABNORMAL LOW
Glucose: 184 mg/dL — ABNORMAL HIGH (ref 65–99)
Osmolality: 275 (ref 275–301)
Potassium: 4.3 mmol/L (ref 3.5–5.1)
SGPT (ALT): 34 U/L (ref 12–78)
Sodium: 134 mmol/L — ABNORMAL LOW (ref 136–145)

## 2012-06-23 LAB — CBC CANCER CENTER
Basophil #: 0 x10 3/mm (ref 0.0–0.1)
Basophil %: 0.7 %
Eosinophil #: 0 x10 3/mm (ref 0.0–0.7)
Eosinophil %: 0.4 %
HCT: 27.3 % — ABNORMAL LOW (ref 35.0–47.0)
HGB: 8.6 g/dL — ABNORMAL LOW (ref 12.0–16.0)
Lymphocyte #: 1.9 x10 3/mm (ref 1.0–3.6)
Lymphocyte %: 45.7 %
MCH: 24.8 pg — ABNORMAL LOW (ref 26.0–34.0)
MCHC: 31.7 g/dL — ABNORMAL LOW (ref 32.0–36.0)
MCV: 78 fL — ABNORMAL LOW (ref 80–100)
Monocyte #: 0.5 x10 3/mm (ref 0.2–0.9)
Neutrophil #: 1.7 x10 3/mm (ref 1.4–6.5)
Neutrophil %: 41.2 %
Platelet: 202 x10 3/mm (ref 150–440)
RDW: 20.8 % — ABNORMAL HIGH (ref 11.5–14.5)
WBC: 4.2 x10 3/mm (ref 3.6–11.0)

## 2012-06-24 LAB — CA 125: CA 125: 26.3 U/mL (ref 0.0–34.0)

## 2012-06-27 ENCOUNTER — Ambulatory Visit: Payer: Self-pay | Admitting: Gynecologic Oncology

## 2012-06-30 LAB — CBC CANCER CENTER
Basophil %: 0.6 %
Eosinophil %: 0.3 %
HGB: 9.7 g/dL — ABNORMAL LOW (ref 12.0–16.0)
Lymphocyte #: 1.6 x10 3/mm (ref 1.0–3.6)
Lymphocyte %: 43.9 %
MCH: 25.7 pg — ABNORMAL LOW (ref 26.0–34.0)
Monocyte %: 9.3 %
Neutrophil #: 1.7 x10 3/mm (ref 1.4–6.5)
Neutrophil %: 45.9 %
Platelet: 443 x10 3/mm — ABNORMAL HIGH (ref 150–440)
RDW: 21.5 % — ABNORMAL HIGH (ref 11.5–14.5)
WBC: 3.8 x10 3/mm (ref 3.6–11.0)

## 2012-06-30 LAB — PROTIME-INR: INR: 2.4

## 2012-07-06 LAB — CBC CANCER CENTER
Basophil #: 0 x10 3/mm (ref 0.0–0.1)
Basophil %: 0.5 %
Eosinophil %: 0 %
Lymphocyte #: 1.9 x10 3/mm (ref 1.0–3.6)
MCH: 25.3 pg — ABNORMAL LOW (ref 26.0–34.0)
MCV: 78 fL — ABNORMAL LOW (ref 80–100)
Monocyte %: 10.6 %
Neutrophil #: 3.1 x10 3/mm (ref 1.4–6.5)
Neutrophil %: 55 %
RBC: 3.92 10*6/uL (ref 3.80–5.20)
RDW: 22.4 % — ABNORMAL HIGH (ref 11.5–14.5)

## 2012-07-15 LAB — CBC CANCER CENTER
Basophil #: 0 x10 3/mm (ref 0.0–0.1)
Basophil %: 0.5 %
Eosinophil %: 0.6 %
HCT: 31 % — ABNORMAL LOW (ref 35.0–47.0)
HGB: 10 g/dL — ABNORMAL LOW (ref 12.0–16.0)
Lymphocyte #: 2.1 x10 3/mm (ref 1.0–3.6)
Lymphocyte %: 30.6 %
Neutrophil #: 3.8 x10 3/mm (ref 1.4–6.5)
Neutrophil %: 56.3 %
Platelet: 191 x10 3/mm (ref 150–440)
RBC: 3.88 10*6/uL (ref 3.80–5.20)

## 2012-07-15 LAB — COMPREHENSIVE METABOLIC PANEL
Albumin: 3.3 g/dL — ABNORMAL LOW (ref 3.4–5.0)
Alkaline Phosphatase: 147 U/L — ABNORMAL HIGH (ref 50–136)
Anion Gap: 11 (ref 7–16)
Calcium, Total: 8.8 mg/dL (ref 8.5–10.1)
Creatinine: 0.99 mg/dL (ref 0.60–1.30)
EGFR (African American): >60
Glucose: 172 mg/dL — ABNORMAL HIGH (ref 65–99)
Potassium: 4.5 mmol/L (ref 3.5–5.1)
SGOT(AST): 23 U/L (ref 15–37)
Sodium: 137 mmol/L (ref 136–145)
Total Protein: 6.9 g/dL (ref 6.4–8.2)

## 2012-07-22 LAB — CBC CANCER CENTER
Basophil #: 0 x10 3/mm (ref 0.0–0.1)
Basophil %: 0.5 %
HCT: 25.8 % — ABNORMAL LOW (ref 35.0–47.0)
Lymphocyte #: 1.6 x10 3/mm (ref 1.0–3.6)
MCHC: 32.7 g/dL (ref 32.0–36.0)
MCV: 81 fL (ref 80–100)
Monocyte #: 0.3 x10 3/mm (ref 0.2–0.9)
Monocyte %: 5.4 %
Neutrophil #: 3.2 x10 3/mm (ref 1.4–6.5)
Neutrophil %: 63 %
Platelet: 166 x10 3/mm (ref 150–440)
RDW: 27.7 % — ABNORMAL HIGH (ref 11.5–14.5)

## 2012-07-25 ENCOUNTER — Ambulatory Visit: Payer: Self-pay | Admitting: Oncology

## 2012-07-25 ENCOUNTER — Ambulatory Visit: Payer: Self-pay | Admitting: Gynecologic Oncology

## 2012-07-29 LAB — BASIC METABOLIC PANEL
Anion Gap: 11 (ref 7–16)
BUN: 18 mg/dL (ref 7–18)
Chloride: 103 mmol/L (ref 98–107)
Co2: 27 mmol/L (ref 21–32)
EGFR (African American): 59 — ABNORMAL LOW
EGFR (Non-African Amer.): 51 — ABNORMAL LOW
Glucose: 86 mg/dL (ref 65–99)
Osmolality: 282 (ref 275–301)
Potassium: 3.9 mmol/L (ref 3.5–5.1)
Sodium: 141 mmol/L (ref 136–145)

## 2012-07-29 LAB — CBC CANCER CENTER
Basophil #: 0.1 x10 3/mm (ref 0.0–0.1)
Eosinophil #: 0 x10 3/mm (ref 0.0–0.7)
Eosinophil %: 0 %
HCT: 26.5 % — ABNORMAL LOW (ref 35.0–47.0)
HGB: 8.7 g/dL — ABNORMAL LOW (ref 12.0–16.0)
MCH: 26.6 pg (ref 26.0–34.0)
MCV: 81 fL (ref 80–100)
Neutrophil %: 53.2 %
Platelet: 151 x10 3/mm (ref 150–440)
RBC: 3.27 10*6/uL — ABNORMAL LOW (ref 3.80–5.20)

## 2012-07-29 LAB — HEPATIC FUNCTION PANEL A (ARMC)
Albumin: 3.3 g/dL — ABNORMAL LOW (ref 3.4–5.0)
Alkaline Phosphatase: 104 U/L (ref 50–136)
Bilirubin,Total: 0.2 mg/dL (ref 0.2–1.0)
Total Protein: 6.6 g/dL (ref 6.4–8.2)

## 2012-08-05 LAB — BASIC METABOLIC PANEL
Anion Gap: 10 (ref 7–16)
BUN: 23 mg/dL — ABNORMAL HIGH (ref 7–18)
Chloride: 104 mmol/L (ref 98–107)
Creatinine: 1.08 mg/dL (ref 0.60–1.30)
EGFR (African American): >60
EGFR (Non-African Amer.): 54 — ABNORMAL LOW
Glucose: 165 mg/dL — ABNORMAL HIGH (ref 65–99)
Osmolality: 287 (ref 275–301)
Sodium: 140 mmol/L (ref 136–145)

## 2012-08-05 LAB — CBC CANCER CENTER
Basophil #: 0 x10 3/mm (ref 0.0–0.1)
Basophil %: 0.5 %
Eosinophil #: 0 x10 3/mm (ref 0.0–0.7)
HCT: 29.1 % — ABNORMAL LOW (ref 35.0–47.0)
Lymphocyte %: 37.6 %
MCHC: 31.8 g/dL — ABNORMAL LOW (ref 32.0–36.0)
MCV: 82 fL (ref 80–100)
Monocyte #: 0.6 x10 3/mm (ref 0.2–0.9)
Monocyte %: 12 %
Platelet: 129 x10 3/mm — ABNORMAL LOW (ref 150–440)
RBC: 3.54 10*6/uL — ABNORMAL LOW (ref 3.80–5.20)
RDW: 29.8 % — ABNORMAL HIGH (ref 11.5–14.5)

## 2012-08-12 LAB — CBC CANCER CENTER
Basophil #: 0.1 x10 3/mm (ref 0.0–0.1)
Basophil %: 1.2 %
Eosinophil #: 0 x10 3/mm (ref 0.0–0.7)
Eosinophil %: 0.2 %
HCT: 28.1 % — ABNORMAL LOW (ref 35.0–47.0)
HGB: 9.2 g/dL — ABNORMAL LOW (ref 12.0–16.0)
Lymphocyte #: 1.9 x10 3/mm (ref 1.0–3.6)
MCH: 27.2 pg (ref 26.0–34.0)
Monocyte #: 0.4 x10 3/mm (ref 0.2–0.9)
Monocyte %: 6.9 %
Neutrophil #: 3.8 x10 3/mm (ref 1.4–6.5)
Neutrophil %: 60.8 %
RDW: 30.4 % — ABNORMAL HIGH (ref 11.5–14.5)
WBC: 6.3 x10 3/mm (ref 3.6–11.0)

## 2012-08-13 LAB — CA 125: CA 125: 24.4 U/mL

## 2012-08-19 LAB — CBC CANCER CENTER
Basophil #: 0 x10 3/mm (ref 0.0–0.1)
Eosinophil #: 0 x10 3/mm (ref 0.0–0.7)
Eosinophil %: 0.2 %
HCT: 23 % — ABNORMAL LOW (ref 35.0–47.0)
HGB: 7.5 g/dL — ABNORMAL LOW (ref 12.0–16.0)
Lymphocyte #: 1.4 x10 3/mm (ref 1.0–3.6)
Lymphocyte %: 37 %
MCH: 27.7 pg (ref 26.0–34.0)
MCV: 85 fL (ref 80–100)
Monocyte %: 15.8 %
RBC: 2.72 10*6/uL — ABNORMAL LOW (ref 3.80–5.20)
RDW: 32 % — ABNORMAL HIGH (ref 11.5–14.5)
WBC: 3.8 x10 3/mm (ref 3.6–11.0)

## 2012-08-19 LAB — PROTIME-INR
INR: 3.7
Prothrombin Time: 35.4 secs — ABNORMAL HIGH (ref 11.5–14.7)

## 2012-08-25 ENCOUNTER — Ambulatory Visit: Payer: Self-pay | Admitting: Gynecologic Oncology

## 2012-09-09 LAB — COMPREHENSIVE METABOLIC PANEL
Alkaline Phosphatase: 132 U/L (ref 50–136)
Anion Gap: 8 (ref 7–16)
BUN: 18 mg/dL (ref 7–18)
Bilirubin,Total: 0.1 mg/dL — ABNORMAL LOW (ref 0.2–1.0)
Calcium, Total: 9.3 mg/dL (ref 8.5–10.1)
Co2: 27 mmol/L (ref 21–32)
Creatinine: 1.07 mg/dL (ref 0.60–1.30)
EGFR (Non-African Amer.): 54 — ABNORMAL LOW
Glucose: 134 mg/dL — ABNORMAL HIGH (ref 65–99)
Potassium: 4.6 mmol/L (ref 3.5–5.1)
SGPT (ALT): 33 U/L (ref 12–78)
Total Protein: 7 g/dL (ref 6.4–8.2)

## 2012-09-09 LAB — CBC CANCER CENTER
Basophil #: 0.1 x10 3/mm (ref 0.0–0.1)
Basophil %: 1 %
HCT: 38.3 % (ref 35.0–47.0)
HGB: 13 g/dL (ref 12.0–16.0)
Lymphocyte #: 1.6 x10 3/mm (ref 1.0–3.6)
Lymphocyte %: 22.9 %
MCHC: 33.9 g/dL (ref 32.0–36.0)
Neutrophil #: 4.4 x10 3/mm (ref 1.4–6.5)
Neutrophil %: 63.3 %
Platelet: 192 x10 3/mm (ref 150–440)
RBC: 4.58 10*6/uL (ref 3.80–5.20)
RDW: 27.9 % — ABNORMAL HIGH (ref 11.5–14.5)

## 2012-09-10 LAB — CA 125: CA 125: 20.2 U/mL (ref 0.0–34.0)

## 2012-09-17 ENCOUNTER — Other Ambulatory Visit: Payer: Self-pay | Admitting: Internal Medicine

## 2012-09-18 ENCOUNTER — Telehealth: Payer: Self-pay | Admitting: *Deleted

## 2012-09-18 DIAGNOSIS — E039 Hypothyroidism, unspecified: Secondary | ICD-10-CM

## 2012-09-18 DIAGNOSIS — E119 Type 2 diabetes mellitus without complications: Secondary | ICD-10-CM

## 2012-09-18 MED ORDER — VENLAFAXINE HCL 75 MG PO TABS: ORAL_TABLET | ORAL | Status: DC

## 2012-09-18 NOTE — Telephone Encounter (Addendum)
She is overdue for follow up on diabetes and depression  30 days only.  Needs appt and fasting labs prior to visit . Refill sent,

## 2012-09-18 NOTE — Telephone Encounter (Signed)
Janet Arnold called and stated she needs refill on Effexor, that she has not seen you since October is okay to refill?

## 2012-09-21 MED ORDER — CITALOPRAM HYDROBROMIDE 20 MG PO TABS: 20.0000 mg | ORAL_TABLET | Freq: Every day | ORAL | Status: DC

## 2012-09-21 NOTE — Telephone Encounter (Signed)
We can try citalopram 20 mg daily .   i will send in

## 2012-09-21 NOTE — Telephone Encounter (Signed)
She cannot take the effexor because she cannot afford it , can you subscribe something else

## 2012-09-22 NOTE — Telephone Encounter (Signed)
Patient notified and appointment made for 5/20.

## 2012-09-24 ENCOUNTER — Ambulatory Visit: Payer: Self-pay | Admitting: Gynecologic Oncology

## 2012-10-05 ENCOUNTER — Other Ambulatory Visit: Payer: Self-pay | Admitting: Internal Medicine

## 2012-10-09 LAB — PROTIME-INR
INR: 1.8
Prothrombin Time: 20.9 secs — ABNORMAL HIGH (ref 11.5–14.7)

## 2012-10-13 ENCOUNTER — Encounter: Payer: Self-pay | Admitting: Internal Medicine

## 2012-10-13 ENCOUNTER — Ambulatory Visit (INDEPENDENT_AMBULATORY_CARE_PROVIDER_SITE_OTHER): Payer: Medicare Other | Admitting: Internal Medicine

## 2012-10-13 VITALS — BP 114/70 | HR 70 | Temp 98.3°F | Resp 14 | Wt 145.2 lb

## 2012-10-13 DIAGNOSIS — E785 Hyperlipidemia, unspecified: Secondary | ICD-10-CM

## 2012-10-13 DIAGNOSIS — R232 Flushing: Secondary | ICD-10-CM

## 2012-10-13 DIAGNOSIS — E279 Disorder of adrenal gland, unspecified: Secondary | ICD-10-CM

## 2012-10-13 DIAGNOSIS — E1159 Type 2 diabetes mellitus with other circulatory complications: Secondary | ICD-10-CM

## 2012-10-13 DIAGNOSIS — E042 Nontoxic multinodular goiter: Secondary | ICD-10-CM

## 2012-10-13 DIAGNOSIS — E1151 Type 2 diabetes mellitus with diabetic peripheral angiopathy without gangrene: Secondary | ICD-10-CM

## 2012-10-13 DIAGNOSIS — I739 Peripheral vascular disease, unspecified: Secondary | ICD-10-CM

## 2012-10-13 MED ORDER — ALPRAZOLAM 0.5 MG PO TABS: 0.5000 mg | ORAL_TABLET | Freq: Every evening | ORAL | Status: DC | PRN

## 2012-10-13 NOTE — Patient Instructions (Addendum)
You may want to try adding metamucil  To see if it binds your stools together so you have less frequent stools per day (continue floraque)  Return for fasting labs   Drop off insurance formulary for review of SSRIs that they will pay for,.   Trial of alprazolam for anxiety and sleep  (0.5 mg twice daily,  May increase to 2 at night if needed )   We will refill meds based on review of blood work needed this week     EAt  often ,  Stick to low GI foods in quantity:    All of the foods can be found at grocery stores and in bulk at [employer].  The Atkins protein bars and shakes are available in more varieties at Target, WalMart and [employer].     7 AM Breakfast:  Choose from the following:  Low carbohydrate Protein  Shakes (I recommend the EAS AdvantEdge "Carb Control" shakes  Or the low carb shakes by Atkins.    2.5 carbs   Arnold's "Sandwhich Thin"toasted  w/ peanut butter (no jelly: about 20 net carbs  "Bagel Thin" with cream cheese and salmon: about 20 carbs   a scrambled egg/bacon/cheese burrito made with Mission's "carb balance" whole wheat tortilla  (about 10 net carbs )   Avoid cereal and bananas, oatmeal and cream of wheat and grits. They are loaded with carbohydrates!   10 AM: high protein snack  Protein bar by Atkins (the snack size, under 200 cal, usually < 6 net carbs).    A stick of cheese:  Around 1 carb,  100 cal     Dannon Light n Fit [place] Yogurt  (80 cal, 8 carbs)  Other so called "protein bars" and Greek yogurts tend to be loaded with carbohydrates.  Remember, in food advertising, the word "energy" is synonymous for " carbohydrate."  Lunch:   A Sandwich using the bread choices listed, Can use any  Eggs,  lunchmeat, grilled meat or canned tuna), avocado, regular mayo/mustard  and cheese.  A Salad using blue cheese, ranch,  Goddess or vinagrette,  No croutons or "confetti" and no "candied nuts" but regular nuts OK.   No pretzels or chips.  Pickles and miniature  sweet peppers are a good low carb alternative that provide a "crunch"  The bread is the only source of carbohydrate in a sandwich and  can be decreased by trying some of these alternatives to traditional loaf bread  Joseph's makes a pita bread and a flat bread that are 50 cal and 4 net carbs available at BJs and WalMart.  This can be toasted to use with hummous as well  Toufayan makes a low carb flatbread that's 100 cal and 9 net carbs available at [employer] and [employer] makes 2 sizes of  Low carb whole wheat tortilla  (The large one is 210 cal and 6 net carbs) Avoid "Low fat dressings, as well as Reyne Dumas and [address] dressings They are loaded with sugar!   3 PM/ Mid day  Snack:  Consider  1 ounce of  almonds, walnuts, pistachios, pecans, peanuts,  Macadamia nuts or a nut medley.  Avoid "granola"; the dried cranberries and raisins are loaded with carbohydrates. Mixed nuts as long as there are no raisins,  cranberries or dried fruit.     6 PM  Dinner:     Meat/fowl/fish with a green salad, and either broccoli, cauliflower, green beans, spinach, brussel sprouts or  Lima beans. DO NOT BREAD THE PROTEIN!!      There is a low carb pasta by Dreamfield's that is acceptable and tastes great: only 5 digestible carbs/serving.( All grocery stores but BJs carry it )  Try Kai Levins Angelo's chicken piccata or chicken or eggplant parm over low carb pasta.(Lowes and BJs)   Clifton Custard Sanchez's "Carnitas" (pulled pork, no sauce,  0 carbs) or his beef pot roast to make a dinner burrito (at BJ's)  Pesto over low carb pasta (bj's sells a good quality pesto in the center refrigerated section of the deli   Whole wheat pasta is still full of digestible carbs and  Not as low in glycemic index as Dreamfield's.   Brown rice is still rice,  So skip the rice and noodles if you eat [place] or [place] (or at least limit to 1/2 cup)  9 PM snack :   Breyer's "low carb" fudgsicle or  ice cream bar (Carb Smart line), or   Weight Watcher's ice cream bar , or another "no sugar added" ice cream;  a serving of fresh berries/cherries with whipped cream   Cheese or DANNON'S LlGHT [place] YOGURT  Avoid bananas, pineapple, grapes  and watermelon on a regular basis because they are high in sugar.  THINK OF THEM AS DESSERT  Remember that snack Substitutions should be less than 10 NET carbs per serving and meals < 20 carbs. Remember to subtract fiber grams to get the "net carbs."

## 2012-10-13 NOTE — Progress Notes (Signed)
Patient ID: Janet Arnold, female   DOB: 1946/01/15, 67 y.o.   MRN: [id number]     Patient Active Problem List   Diagnosis Date Noted  . Preop cardiovascular exam 03/10/2012  . Diarrhea, functional 02/10/2012  . Vasomotor flushing 01/02/2012  . Adrenal abnormality   . Multinodular non-toxic goiter 11/13/2011  . Depression 11/13/2011  . Hypertension 08/21/2011  . Diabetes mellitus with peripheral artery disease   . HYPERLIPIDEMIA-MIXED 04/16/2010  . CAD, NATIVE VESSEL 04/16/2010  . PVD 04/16/2010    Subjective:  CC:   Chief Complaint  Patient presents with  . Follow-up    HPI:   Janet Arnold a 67 y.o. female who presents 6 month follow up of chronic issues.  Since patient's last visit she was diagnosed with ovarian CA with peritoneal carcinomatosis found incidentally on CT of abdomen ordered by GI .  She has undergone debulking including resection of bowel  And omentum and is very concerned about her 70% chance of recurrence.  She continues to have frequent sweats that are disrupting her sleep, and cannot afford the effexor that was offered after her diagnosis due to cost of even the generic due to her drug plan.  She is having a lot of anxiety about her prognosis, and difficulty with memory and concentration which she has attributed to "chemo brain."  Her blood sugars have been running high at times but are usually 130 to 150.  She feels hungry all the time, despite eating often,  Diet is full of fruits and vegetables,  Has gained back a significant amount of the weight she lost during chemotherapy.    Past Medical History  Diagnosis Date  . Hypertension   . Diabetes mellitus   . DDD (degenerative disc disease)   . Coronary artery disease     last cath 2011,  nonocclusive  . Diabetes mellitus with peripheral artery disease 2001    s/p stent to right iliac artery (?) done at [facility]   . Adrenal abnormality March 2012    indicendtal finding by review of CT report on [place]   . Ovarian cancer     Past Surgical History  Procedure Laterality Date  . Cholecystectomy    . Partial hysterectomy    . Tonsillectomy    . Abdominal hysterectomy    . Angioplasty / stenting iliac  April 2013    bilateral  . Cardiac catheterization         The following portions of the patient's history were reviewed and updated as appropriate: Allergies, current medications, and problem list.    Review of Systems:   12 Pt  review of systems was negative except those addressed in the HPI,     History   Social History  . Marital Status: Single    Spouse Name: N/A    Number of Children: N/A  . Years of Education: N/A   Occupational History  . Not on file.   Social History Main Topics  . Smoking status: Former Smoker -- 1.50 packs/day    Types: Cigarettes    Quit date: 03/30/2010  . Smokeless tobacco: Never Used  . Alcohol Use: No  . Drug Use: No  . Sexually Active:    Other Topics Concern  . Not on file   Social History Narrative  . No narrative on file    Objective:  BP 114/70  Pulse 70  Temp(Src) 98.3 F (36.8 C) (Oral)  Resp 14  Wt 145 lb 4 oz (65.885  kg)  BMI 24.17 kg/m2  SpO2 96%  General appearance: alert, cooperative and appears stated age Ears: normal TM's and external ear canals both ears Throat: lips, mucosa, and tongue normal; teeth and gums normal Neck: no adenopathy, no carotid bruit, supple, symmetrical, trachea midline and thyroid not enlarged, symmetric, no tenderness/mass/nodules Back: symmetric, no curvature. ROM normal. No CVA tenderness. Lungs: clear to auscultation bilaterally Heart: regular rate and rhythm, S1, S2 normal, no murmur, click, rub or gallop Abdomen: soft, non-tender; bowel sounds normal; no masses,  no organomegaly Pulses: 2+ and symmetric Skin: Skin color, texture, turgor normal. No rashes or lesions Lymph nodes: Cervical, supraclavicular, and axillary nodes normal.  Assessment and Plan:  PVD Recent  vascular follow up May 20 noted persistent symptoms of LLE claudication and she will return to them for right iliac duplex and ABIs.   HYPERLIPIDEMIA-MIXED Her triglycerides are currently  > 400,direct LDL 142,  On red yeast rice twice daily..  Will recommend a 6 week trial of Tricor and crestor and follow up with repeat labs in 6 weeks   Adrenal abnormality She has a 3 cm adrenal nodule on the left which has been stable by serial CTs and is under continued surveillance by Dr. Tedd Sias. Since 24-hour urine collection for catecholamines has been done in the past I will not repeat this now.    Vasomotor flushing Persistent , with no change following extensive debulking of bowel and omentum for metastatic ovarian ca with carcinomatosis.  Multiple possible etiologies.  Has a stable 3 cm  adrenal adenoma with prior screening for catecholamines by Dr. Tedd Sias which was  negative. Workup for carcinoid also negative.  Prior to discovery of ovarian CA a brief trial of HRT was also unsuccessful.  Insurance would not pay for effexor.  Will consider trial of medications for hyperhidrosis, since the sweating is more of a problem and is occurring without hot flashes.   Diabetes mellitus with peripheral artery disease Managed by Dr. Tedd Sias with amaryl and kombiglyze,  a1c is 6.4 and she has no proteinuria.  LDL is above goal and triglycerides > 400.  Will recommend crestor and tricor trial.   Multinodular non-toxic goiter Managed by Dr. Tedd Sias.     Updated Medication List Outpatient Encounter Prescriptions as of 10/13/2012  Medication Sig Dispense Refill  . amLODipine (NORVASC) 10 MG tablet Take 1 tablet (10 mg total) by mouth daily.  30 tablet  5  . citalopram (CELEXA) 20 MG tablet Take 1 tablet (20 mg total) by mouth daily.  30 tablet  3  . glimepiride (AMARYL) 2 MG tablet Twice daily.      Marland Kitchen glucose blood test strip Use as instructed  100 each  12  . KOMBIGLYZE XR 2.09-998 MG TB24 TAKE 1 TABLET BY MOUTH DAILY  AFTER BREAKFAST.  30 tablet  1  . metoprolol (LOPRESSOR) 100 MG tablet TAKE 1 TABLET (100 MG TOTAL) BY MOUTH 2 (TWO) TIMES DAILY.  30 tablet  0  . Oxycodone HCl 10 MG TABS as needed.       . pantoprazole (PROTONIX) 40 MG tablet Take 40 mg by mouth daily.        . pramipexole (MIRAPEX) 0.25 MG tablet Take 0.5 tablets (0.125 mg total) by mouth at bedtime.  30 tablet  2  . Red Yeast Rice 600 MG CAPS Take 1 capsule (600 mg total) by mouth 2 (two) times daily.  60 capsule  3  . warfarin (COUMADIN) 5 MG tablet 5 mg daily.      Marland Kitchen  ALPRAZolam (XANAX) 0.5 MG tablet Take 1 tablet (0.5 mg total) by mouth at bedtime as needed for sleep.  60 tablet  3  . aspirin 81 MG EC tablet Take 81 mg by mouth daily.        Marland Kitchen dicyclomine (BENTYL) 10 MG capsule       . nitroGLYCERIN (NITROSTAT) 0.4 MG SL tablet Place 1 tablet (0.4 mg total) under the tongue every 5 (five) minutes as needed.  25 tablet  6  . PROAIR HFA 108 (90 BASE) MCG/ACT inhaler INHALE 1 TO 2 PUFFS EVERY 4 TO 6 HOURS AS NEEDED  8.5 g  3   No facility-administered encounter medications on file as of 10/13/2012.     No orders of the defined types were placed in this encounter.    No Follow-up on file.

## 2012-10-14 ENCOUNTER — Other Ambulatory Visit (INDEPENDENT_AMBULATORY_CARE_PROVIDER_SITE_OTHER): Payer: Medicare Other

## 2012-10-14 ENCOUNTER — Telehealth: Payer: Self-pay | Admitting: Internal Medicine

## 2012-10-14 DIAGNOSIS — E119 Type 2 diabetes mellitus without complications: Secondary | ICD-10-CM

## 2012-10-14 DIAGNOSIS — E039 Hypothyroidism, unspecified: Secondary | ICD-10-CM

## 2012-10-14 LAB — MICROALBUMIN / CREATININE URINE RATIO
Creatinine,U: 66.8 mg/dL
Microalb, Ur: 0.4 mg/dL (ref 0.0–1.9)

## 2012-10-14 LAB — COMPREHENSIVE METABOLIC PANEL
AST: 14 U/L (ref 0–37)
Alkaline Phosphatase: 79 U/L (ref 39–117)
BUN: 14 mg/dL (ref 6–23)
Glucose, Bld: 129 mg/dL — ABNORMAL HIGH (ref 70–99)
Sodium: 140 mEq/L (ref 135–145)
Total Bilirubin: 0.1 mg/dL — ABNORMAL LOW (ref 0.3–1.2)
Total Protein: 6.7 g/dL (ref 6.0–8.3)

## 2012-10-14 LAB — TSH: TSH: 0.38 u[IU]/mL (ref 0.35–5.50)

## 2012-10-14 LAB — LIPID PANEL
Cholesterol: 243 mg/dL — ABNORMAL HIGH (ref 0–200)
HDL: 31.2 mg/dL — ABNORMAL LOW (ref >39.00)
Triglycerides: 467 mg/dL — ABNORMAL HIGH (ref 0.0–149.0)

## 2012-10-14 LAB — LDL CHOLESTEROL, DIRECT: Direct LDL: 141.9 mg/dL

## 2012-10-14 MED ORDER — FENOFIBRATE MICRONIZED 67 MG PO CAPS: 67.0000 mg | ORAL_CAPSULE | Freq: Every day | ORAL | Status: DC

## 2012-10-14 MED ORDER — ROSUVASTATIN CALCIUM 10 MG PO TABS: 10.0000 mg | ORAL_TABLET | Freq: Every day | ORAL | Status: DC

## 2012-10-14 NOTE — Telephone Encounter (Signed)
Pt came in wanting to know if you would send a rx in for  proctosol-hc 2.5% cream Apply to affected area twice a day [facility]  [place]  Pt also left her insurance book for dr Darrick Huntsman to look at for her meds.  In box

## 2012-10-14 NOTE — Assessment & Plan Note (Signed)
She has a 3 cm adrenal nodule on the left which has been stable by serial CTs and is under continued surveillance by Dr. Tedd Sias. Since 24-hour urine collection for catecholamines has been done in the past I will not repeat this now.

## 2012-10-14 NOTE — Assessment & Plan Note (Signed)
Persistent , with no change following extensive debulking of bowel and oemntum for metastatic ovarian ca with carcinomatosis.  Multiple possible etiologies.  Has a stable 3 cm  adrenal adenoma with prior screening for catecholamines by Dr. Tedd Sias which was  negative.  Prior to discovery of ovarian CA a brief trial of HRT was also unsuccessful.  Insurance would not pay for effexor.  Will consider trial of medications for hyperhidrosis, since the sweating is more of a problem and is occurring without hot flashes.

## 2012-10-14 NOTE — Assessment & Plan Note (Addendum)
Her triglycerides are currently  > 400,dierect LDL 142,  On red yeast rice twice daily..  Will recommend a 6 week trial of Tricor and crestor and follow up with repeat labs in 6 weeks

## 2012-10-14 NOTE — Assessment & Plan Note (Signed)
Recent vascular follow up May 20 noted persistent symptoms of LLE claudication and she will return to them for right iliac duplex and ABIs.

## 2012-10-14 NOTE — Assessment & Plan Note (Signed)
Managed by Dr. Solum 

## 2012-10-14 NOTE — Assessment & Plan Note (Addendum)
Managed by Dr. Tedd Sias with amaryl and kombiglyze,  a1c is 6.4 and she has no proteinuria.  LDL is above goal and triglycerides > 400.  Will recommend crestor and tricor trial.

## 2012-10-18 ENCOUNTER — Other Ambulatory Visit: Payer: Self-pay | Admitting: Internal Medicine

## 2012-10-20 ENCOUNTER — Other Ambulatory Visit: Payer: Self-pay | Admitting: Internal Medicine

## 2012-10-20 ENCOUNTER — Telehealth: Payer: Self-pay | Admitting: Internal Medicine

## 2012-10-20 MED ORDER — ESCITALOPRAM OXALATE 10 MG PO TABS: 10.0000 mg | ORAL_TABLET | Freq: Every day | ORAL | Status: DC

## 2012-10-20 MED ORDER — HYDROCORTISONE 2.5 % RE CREA: TOPICAL_CREAM | Freq: Two times a day (BID) | RECTAL | Status: DC

## 2012-10-20 NOTE — Telephone Encounter (Signed)
Rx sent to pharmacy by escript  

## 2012-10-20 NOTE — Telephone Encounter (Signed)
Left message for patient to return call.

## 2012-10-20 NOTE — Telephone Encounter (Signed)
Patient notified

## 2012-10-20 NOTE — Telephone Encounter (Signed)
She was prescribed xanax at her visit on May 20th.  You have the script?  If not resend to pharmacy.  Will send the proctosol

## 2012-10-20 NOTE — Telephone Encounter (Signed)
proctosol 2.5 % for hemorrhoids  Patient would like for you to prescribe if possible and stated she talked with you about xanax for depression patient states she feels as though she needs something and would like the xanax please advise.

## 2012-10-20 NOTE — Telephone Encounter (Signed)
I reviewed her formulary.  Generic lexapro for hot flashes is tier 2  ,  Sent rx for 10 mf to her  pharmacy,,  Start with 1/2 tablet daily

## 2012-10-25 ENCOUNTER — Ambulatory Visit: Payer: Self-pay | Admitting: Oncology

## 2012-10-29 ENCOUNTER — Ambulatory Visit: Payer: Self-pay | Admitting: Vascular Surgery

## 2012-10-29 LAB — BASIC METABOLIC PANEL
Anion Gap: 6 — ABNORMAL LOW (ref 7–16)
BUN: 18 mg/dL (ref 7–18)
Calcium, Total: 9.4 mg/dL (ref 8.5–10.1)
Chloride: 107 mmol/L (ref 98–107)
Co2: 28 mmol/L (ref 21–32)
Creatinine: 1.02 mg/dL (ref 0.60–1.30)
Osmolality: 282 (ref 275–301)
Potassium: 4.1 mmol/L (ref 3.5–5.1)
Sodium: 141 mmol/L (ref 136–145)

## 2012-10-29 LAB — PROTIME-INR: Prothrombin Time: 22.1 secs — ABNORMAL HIGH (ref 11.5–14.7)

## 2012-11-04 LAB — CBC CANCER CENTER
Basophil #: 0 x10 3/mm (ref 0.0–0.1)
HGB: 10.8 g/dL — ABNORMAL LOW (ref 12.0–16.0)
Lymphocyte #: 1.1 x10 3/mm (ref 1.0–3.6)
MCV: 92 fL (ref 80–100)
Monocyte #: 0.5 x10 3/mm (ref 0.2–0.9)
Monocyte %: 8.2 %
Neutrophil %: 71 %
Platelet: 279 x10 3/mm (ref 150–440)
RBC: 3.47 10*6/uL — ABNORMAL LOW (ref 3.80–5.20)
RDW: 22.9 % — ABNORMAL HIGH (ref 11.5–14.5)
WBC: 6 x10 3/mm (ref 3.6–11.0)

## 2012-11-04 LAB — COMPREHENSIVE METABOLIC PANEL
Alkaline Phosphatase: 93 U/L (ref 50–136)
Anion Gap: 7 (ref 7–16)
Chloride: 103 mmol/L (ref 98–107)
Co2: 30 mmol/L (ref 21–32)
Creatinine: 1.03 mg/dL (ref 0.60–1.30)
Osmolality: 283 (ref 275–301)
SGOT(AST): 14 U/L — ABNORMAL LOW (ref 15–37)
SGPT (ALT): 23 U/L (ref 12–78)
Sodium: 140 mmol/L (ref 136–145)
Total Protein: 6.9 g/dL (ref 6.4–8.2)

## 2012-11-12 ENCOUNTER — Ambulatory Visit (INDEPENDENT_AMBULATORY_CARE_PROVIDER_SITE_OTHER): Payer: Medicare Other | Admitting: Adult Health

## 2012-11-12 ENCOUNTER — Encounter: Payer: Self-pay | Admitting: Adult Health

## 2012-11-12 VITALS — BP 110/62 | HR 62 | Resp 12 | Wt 149.0 lb

## 2012-11-12 DIAGNOSIS — F419 Anxiety disorder, unspecified: Secondary | ICD-10-CM

## 2012-11-12 DIAGNOSIS — G479 Sleep disorder, unspecified: Secondary | ICD-10-CM

## 2012-11-12 DIAGNOSIS — F411 Generalized anxiety disorder: Secondary | ICD-10-CM

## 2012-11-12 MED ORDER — ZOLPIDEM TARTRATE 5 MG PO TABS: 5.0000 mg | ORAL_TABLET | Freq: Every evening | ORAL | Status: AC | PRN

## 2012-11-12 MED ORDER — ALPRAZOLAM 0.25 MG PO TABS: ORAL_TABLET | ORAL | Status: DC

## 2012-11-12 NOTE — Assessment & Plan Note (Signed)
Surrounding recent diagnosis of ovarian/peritoneal cancer and possibility of recurrence. Patient recently completed tx in March. She had been very busy with the tx with little time to think. Now that tx is completed she finds herself thinking about the possibility of recurrence. Encouraged meeting with support group at the [facility]. Also, offered referral to therapist. Someone she can speak to freely and not have to worry about what she says. She is going to think about it. Start Xanax 0.25 mg bid as needed for anxiety. RTC in 4 weeks for f/u.

## 2012-11-12 NOTE — Assessment & Plan Note (Signed)
Ongoing for last 4 weeks or so. Surrounding recent diagnosis of ovarian/peritoneal cancer and possibility of recurrence. Patient recently completed tx in March. She had been very busy with the tx with little time to think. Now that tx is completed she finds herself thinking about the possibility of recurrence. She has tried [place] in the past with good results. Will order. RTC in 4 weeks for f/u.

## 2012-11-12 NOTE — Patient Instructions (Addendum)
  You are doing very well.  For sleep I am prescribing Ambien 5 mg. Take 1 at bedtime as needed for those days you are having trouble sleeping.  For your anxiety I am prescribing Xanax 0.25 mg twice daily as needed.  Consider going to the support groups at the [facility].  I can also refer you to a therapist if you would like.  Return for follow up in 4 weeks.

## 2012-11-12 NOTE — Progress Notes (Signed)
  Subjective:    Patient ID: Janet Arnold, female    DOB: [birth year], 67 y.o.   MRN: [id number]  HPI  Patient is a 67 y/o female who presents to clinic with c/o inability to sleep, increased anxiety. She recently completed chemo for peritoneal/ ovarian cancer. Recent tumor markers were wnl but she is very anxious about the possibility of recurrence. Patient has a very good support symptom but she doesn't feel she could talk about her concerns with her family for fear that it will upset them. She has friends she spends time with often and she also has a good [employer] family that is very supportive.   Current Outpatient Prescriptions on File Prior to Visit  Medication Sig Dispense Refill  . amLODipine (NORVASC) 10 MG tablet Take 1 tablet (10 mg total) by mouth daily.  30 tablet  5  . citalopram (CELEXA) 20 MG tablet Take 1 tablet (20 mg total) by mouth daily.  30 tablet  3  . escitalopram (LEXAPRO) 10 MG tablet Take 1 tablet (10 mg total) by mouth daily.  30 tablet  3  . fenofibrate micronized (LOFIBRA) 67 MG capsule Take 1 capsule (67 mg total) by mouth daily before breakfast.  30 capsule  3  . glimepiride (AMARYL) 2 MG tablet Twice daily.      Marland Kitchen glucose blood test strip Use as instructed  100 each  12  . KOMBIGLYZE XR 2.09-998 MG TB24 TAKE 1 TABLET BY MOUTH DAILY AFTER BREAKFAST.  30 tablet  1  . metoprolol (LOPRESSOR) 100 MG tablet TAKE 1 TABLET (100 MG TOTAL) BY MOUTH 2 (TWO) TIMES DAILY.  60 tablet  5  . Oxycodone HCl 10 MG TABS as needed.       . pramipexole (MIRAPEX) 0.25 MG tablet Take 0.5 tablets (0.125 mg total) by mouth at bedtime.  30 tablet  2  . Red Yeast Rice 600 MG CAPS Take 1 capsule (600 mg total) by mouth 2 (two) times daily.  60 capsule  3  . warfarin (COUMADIN) 5 MG tablet 5 mg daily.      . hydrocortisone (PROCTOSOL HC) 2.5 % rectal cream Place rectally 2 (two) times daily.  30 g  2  . nitroGLYCERIN (NITROSTAT) 0.4 MG SL tablet Place 1 tablet (0.4 mg total) under the tongue  every 5 (five) minutes as needed.  25 tablet  6   No current facility-administered medications on file prior to visit.     Review of Systems  Constitutional: Negative.   Respiratory: Negative.   Cardiovascular: Negative.   Psychiatric/Behavioral: Positive for sleep disturbance. Negative for suicidal ideas, behavioral problems, confusion, self-injury and agitation. The patient is nervous/anxious.        Objective:   Physical Exam  Constitutional: She is oriented to person, place, and time.  Overweight, pleasant female in NAD  Cardiovascular: Normal rate, regular rhythm and normal heart sounds.  Exam reveals no gallop and no friction rub.   No murmur heard. Pulmonary/Chest: Effort normal and breath sounds normal. No respiratory distress. She has no wheezes. She has no rales.  Neurological: She is alert and oriented to person, place, and time.  Psychiatric: She has a normal mood and affect. Her behavior is normal. Judgment and thought content normal.          Assessment & Plan:

## 2012-11-19 ENCOUNTER — Telehealth: Payer: Self-pay | Admitting: *Deleted

## 2012-11-19 NOTE — Telephone Encounter (Signed)
No,  Dc celexa,  continue lexapro

## 2012-11-19 NOTE — Telephone Encounter (Signed)
Pharmacy would like to know if you intended for patient to be on both Lexapro and celexa , please advise.

## 2012-11-20 NOTE — Telephone Encounter (Signed)
Left detailed information on pharmacy voicemail with information

## 2012-11-24 ENCOUNTER — Ambulatory Visit: Payer: Self-pay | Admitting: Gynecologic Oncology

## 2012-11-24 ENCOUNTER — Ambulatory Visit: Payer: Self-pay | Admitting: Oncology

## 2012-12-01 ENCOUNTER — Other Ambulatory Visit: Payer: Self-pay | Admitting: Internal Medicine

## 2012-12-02 ENCOUNTER — Other Ambulatory Visit: Payer: Self-pay | Admitting: Internal Medicine

## 2012-12-02 LAB — PROTIME-INR: INR: 1.2

## 2012-12-03 ENCOUNTER — Telehealth: Payer: Self-pay | Admitting: *Deleted

## 2012-12-03 LAB — CA 125: CA 125: 16 U/mL (ref 0.0–34.0)

## 2012-12-03 MED ORDER — SAXAGLIPTIN-METFORMIN ER 2.5-1000 MG PO TB24: ORAL_TABLET | ORAL | Status: AC

## 2012-12-03 NOTE — Telephone Encounter (Signed)
Patient called for refill sent to pharmacy

## 2012-12-09 LAB — PROTIME-INR
INR: 1.8
Prothrombin Time: 20.6 secs — ABNORMAL HIGH (ref 11.5–14.7)

## 2012-12-10 ENCOUNTER — Encounter: Payer: Self-pay | Admitting: Internal Medicine

## 2012-12-10 ENCOUNTER — Ambulatory Visit (INDEPENDENT_AMBULATORY_CARE_PROVIDER_SITE_OTHER): Payer: Medicare Other | Admitting: Internal Medicine

## 2012-12-10 VITALS — BP 138/64 | HR 65 | Temp 98.0°F | Resp 14 | Wt 152.5 lb

## 2012-12-10 DIAGNOSIS — E279 Disorder of adrenal gland, unspecified: Secondary | ICD-10-CM

## 2012-12-10 DIAGNOSIS — I10 Essential (primary) hypertension: Secondary | ICD-10-CM

## 2012-12-10 DIAGNOSIS — E1159 Type 2 diabetes mellitus with other circulatory complications: Secondary | ICD-10-CM

## 2012-12-10 DIAGNOSIS — Z8543 Personal history of malignant neoplasm of ovary: Secondary | ICD-10-CM

## 2012-12-10 DIAGNOSIS — J309 Allergic rhinitis, unspecified: Secondary | ICD-10-CM

## 2012-12-10 DIAGNOSIS — F419 Anxiety disorder, unspecified: Secondary | ICD-10-CM

## 2012-12-10 DIAGNOSIS — E785 Hyperlipidemia, unspecified: Secondary | ICD-10-CM

## 2012-12-10 DIAGNOSIS — I739 Peripheral vascular disease, unspecified: Secondary | ICD-10-CM

## 2012-12-10 DIAGNOSIS — F411 Generalized anxiety disorder: Secondary | ICD-10-CM

## 2012-12-10 DIAGNOSIS — G479 Sleep disorder, unspecified: Secondary | ICD-10-CM

## 2012-12-10 DIAGNOSIS — E1151 Type 2 diabetes mellitus with diabetic peripheral angiopathy without gangrene: Secondary | ICD-10-CM

## 2012-12-10 MED ORDER — CLONAZEPAM 0.5 MG PO TABS: 0.5000 mg | ORAL_TABLET | Freq: Two times a day (BID) | ORAL | Status: DC | PRN

## 2012-12-10 MED ORDER — ALBUTEROL SULFATE HFA 108 (90 BASE) MCG/ACT IN AERS: 2.0000 | INHALATION_SPRAY | Freq: Four times a day (QID) | RESPIRATORY_TRACT | Status: AC | PRN

## 2012-12-10 NOTE — Progress Notes (Signed)
Patient ID: Janet Arnold, female   DOB: [birth year], 67 y.o.   MRN: [id number]   Patient Active Problem List   Diagnosis Date Noted  . Allergic rhinitis 12/12/2012  . H/O ovarian cancer 12/12/2012  . Anxiety 11/12/2012  . Sleep disturbance 11/12/2012  . Preop cardiovascular exam 03/10/2012  . Vasomotor flushing 01/02/2012  . Adrenal abnormality   . Multinodular non-toxic goiter 11/13/2011  . Depression 11/13/2011  . Hypertension 08/21/2011  . Diabetes mellitus with peripheral artery disease   . HYPERLIPIDEMIA-MIXED 04/16/2010  . CAD, NATIVE VESSEL 04/16/2010  . PVD 04/16/2010    Subjective:  CC:   Chief Complaint  Patient presents with  . Follow-up    For anxiety    HPI:   Janet Arnold a 67 y.o. female who presents 6 month follow up of chronic issues.  Since patients last visit she was diagnosed with ovarian CA with peritoneal carcinomatosis found incidentally on CT of abdomen ordered by GI .  She has undergone debulking including resection of bowel  And omentum and is very concerned about her 70% chance of recurrence.  She continues to have frequent sweats that are disrupting her sleep, and cannot afford the effexor that was offered after her diagnosis due to cost of even the generic due to her drug plan.  She is having a lot of anxiety about her prognosis, and difficulty with memory and concentration which she has attributed to "chemo brain."  Her blood sugars have been running high at times but are usually 130 to 150.  She feels hungry all the time, despite eating often,  Diet is full of fruits and vegetables,  Has gained back a significant amount of the weight she lost during chemotherapy.    Past Medical History  Diagnosis Date  . Hypertension   . Diabetes mellitus   . DDD (degenerative disc disease)   . Coronary artery disease     last cath 2011,  nonocclusive  . Diabetes mellitus with peripheral artery disease 2001    s/p stent to right iliac artery (?) done at  [facility]   . Adrenal abnormality March 2012    indicendtal finding by review of CT report on [place]  . Ovarian cancer     Past Surgical History  Procedure Laterality Date  . Cholecystectomy    . Partial hysterectomy    . Tonsillectomy    . Abdominal hysterectomy    . Angioplasty / stenting iliac  April 2013    bilateral  . Cardiac catheterization         The following portions of the patient's history were reviewed and updated as appropriate: Allergies, current medications, and problem list.    Review of Systems:   12 Pt  review of systems was negative except those addressed in the HPI,     History   Social History  . Marital Status: Single    Spouse Name: N/A    Number of Children: N/A  . Years of Education: N/A   Occupational History  . Not on file.   Social History Main Topics  . Smoking status: Former Smoker -- 1.50 packs/day    Types: Cigarettes    Quit date: 03/30/2010  . Smokeless tobacco: Never Used  . Alcohol Use: No  . Drug Use: No  . Sexually Active:    Other Topics Concern  . Not on file   Social History Narrative  . No narrative on file    Objective:  BP 138/64  Pulse  65  Temp(Src) 98 F (36.7 C) (Oral)  Resp 14  Wt 152 lb 8 oz (69.174 kg)  BMI 25.38 kg/m2  SpO2 98%  General appearance: alert, cooperative and appears stated age Ears: normal TM's and external ear canals both ears Throat: lips, mucosa, and tongue normal; teeth and gums normal Neck: no adenopathy, no carotid bruit, supple, symmetrical, trachea midline and thyroid not enlarged, symmetric, no tenderness/mass/nodules Back: symmetric, no curvature. ROM normal. No CVA tenderness. Lungs: clear to auscultation bilaterally Heart: regular rate and rhythm, S1, S2 normal, no murmur, click, rub or gallop Abdomen: soft, non-tender; bowel sounds normal; no masses,  no organomegaly Pulses: 2+ and symmetric Skin: Skin color, texture, turgor normal. No rashes or lesions Lymph  nodes: Cervical, supraclavicular, and axillary nodes normal. Foot exam:  Nails are well trimmed,  No callouses,  Sensation intact to microfilament   Assessment and Plan:  HYPERLIPIDEMIA-MIXED Taking samples of crestor and tricor fore trigs > 400 and ldl > 100 , since may 22,  Return for fastin abs  Anxiety Uncontrolled with bid alprazolam.  Will change to clonazepam bidd and increase lexapro to 10 mg daily   PVD S/p angioplasty and stenting of left EIA and CFA .  She is taking crestor and fenofibrate and is due for fasting lipids  Sleep disturbance Secondary to hot flashes and anxidety.  Increasing lexapro and adding clonazepam   Adrenal abnormality Adrenal tumor was note on recent CT and is under surveillance by Dr Tedd Sias.   Hypertension Well controlled on current regimen. Renal function stable, no changes today.  Diabetes mellitus with peripheral artery disease Well-controlled on current medications.  hemoglobin A1c was < 6.0 in may .  Reminder to have her annual eye exams  Allergic rhinitis Advised to increase allegra to 180 mg daily if not already thaing the maxiumum dose,  And add singulair if she already is.    H/O ovarian cancer S/p debuling for peritoneal carcinomatosis and chemo with Taxol.  She is due foe repeat colonoscopy in September.  Referral to [facility].    Updated Medication List Outpatient Encounter Prescriptions as of 12/10/2012  Medication Sig Dispense Refill  . ALPRAZolam (XANAX) 0.25 MG tablet Take 1 tablet twice a day as needed for anxiety.  60 tablet  3  . amLODipine (NORVASC) 10 MG tablet TAKE 1 TABLET BY MOUTH DAILY  30 tablet  5  . fenofibrate micronized (LOFIBRA) 67 MG capsule Take 1 capsule (67 mg total) by mouth daily before breakfast.  30 capsule  3  . glimepiride (AMARYL) 2 MG tablet TAKE 1 TABLET BY MOUTH TWICE A DAY  60 tablet  11  . glucose blood test strip Use as instructed  100 each  12  . hydrocortisone (PROCTOSOL HC) 2.5 % rectal cream  Place rectally 2 (two) times daily.  30 g  2  . metoprolol (LOPRESSOR) 100 MG tablet TAKE 1 TABLET (100 MG TOTAL) BY MOUTH 2 (TWO) TIMES DAILY.  60 tablet  5  . nitroGLYCERIN (NITROSTAT) 0.4 MG SL tablet Place 1 tablet (0.4 mg total) under the tongue every 5 (five) minutes as needed.  25 tablet  6  . Oxycodone HCl 10 MG TABS as needed.       . pramipexole (MIRAPEX) 0.25 MG tablet Take 0.5 tablets (0.125 mg total) by mouth at bedtime.  30 tablet  2  . Saxagliptin-Metformin (KOMBIGLYZE XR) 2.09-998 MG TB24 TAKE 1 TABLET BY MOUTH DAILY AFTER BREAKFAST.  30 tablet  1  .  warfarin (COUMADIN) 5 MG tablet 7.5 mg daily.       Marland Kitchen zolpidem (AMBIEN) 5 MG tablet Take 1 tablet (5 mg total) by mouth at bedtime as needed for sleep.  30 tablet  3  . [DISCONTINUED] citalopram (CELEXA) 20 MG tablet TAKE 1 TABLET (20 MG TOTAL) BY MOUTH DAILY.  30 tablet  2  . [DISCONTINUED] glimepiride (AMARYL) 2 MG tablet Twice daily.      . [DISCONTINUED] Red Yeast Rice 600 MG CAPS Take 1 capsule (600 mg total) by mouth 2 (two) times daily.  60 capsule  3  . albuterol (PROVENTIL HFA;VENTOLIN HFA) 108 (90 BASE) MCG/ACT inhaler Inhale 2 puffs into the lungs every 6 (six) hours as needed for wheezing.  3.7 g  4  . clonazePAM (KLONOPIN) 0.5 MG tablet Take 1 tablet (0.5 mg total) by mouth 2 (two) times daily as needed for anxiety.  60 tablet  1  . escitalopram (LEXAPRO) 10 MG tablet Take 1 tablet (10 mg total) by mouth daily.  30 tablet  3   No facility-administered encounter medications on file as of 12/10/2012.     Orders Placed This Encounter  Procedures  . Comprehensive metabolic panel  . Lipid panel  . HM DIABETES FOOT EXAM    No Follow-up on file.

## 2012-12-10 NOTE — Assessment & Plan Note (Signed)
Taking samples of crestor and tricor fore trigs > 400 and ldl > 100 , since may 22,  Return for fastin abs

## 2012-12-10 NOTE — Patient Instructions (Addendum)
Check to see if allegra is at maximal dose (180) ; this is your antihistamine.  If at maximum,  we will add singulair which is for allergies and asthma  ProAir  Inhaler 2 puffs every 6 hours if needed for wheezing    Return for fasting lipids as soon as possible.  While still taking fenofibrate and crestor   Trial of klonipin twice daily instead of alprazolam for anxiety   Increase lexapro to 10 mg daily to help your hot flashes and depression

## 2012-12-12 DIAGNOSIS — J309 Allergic rhinitis, unspecified: Secondary | ICD-10-CM | POA: Insufficient documentation

## 2012-12-12 DIAGNOSIS — Z8543 Personal history of malignant neoplasm of ovary: Secondary | ICD-10-CM | POA: Insufficient documentation

## 2012-12-12 NOTE — Assessment & Plan Note (Signed)
Secondary to hot flashes and anxidety.  Increasing lexapro and adding clonazepam

## 2012-12-12 NOTE — Assessment & Plan Note (Signed)
S/p debuling for peritoneal carcinomatosis and chemo with Taxol.  She is due foe repeat colonoscopy in September.  Referral to [facility].

## 2012-12-12 NOTE — Assessment & Plan Note (Signed)
Uncontrolled with bid alprazolam.  Will change to clonazepam bidd and increase lexapro to 10 mg daily

## 2012-12-12 NOTE — Assessment & Plan Note (Signed)
Well-controlled on current medications.  hemoglobin A1c was < 6.0 in may .  Reminder to have her annual eye exams

## 2012-12-12 NOTE — Assessment & Plan Note (Signed)
Adrenal tumor was note on recent CT and is under surveillance by Dr Tedd Sias.

## 2012-12-12 NOTE — Assessment & Plan Note (Addendum)
S/p angioplasty and stenting of left EIA and CFA .  She is taking crestor and fenofibrate and is due for fasting lipids

## 2012-12-12 NOTE — Assessment & Plan Note (Signed)
Advised to increase allegra to 180 mg daily if not already thaing the maxiumum dose,  And add singulair if she already is.

## 2012-12-12 NOTE — Assessment & Plan Note (Signed)
Well controlled on current regimen. Renal function stable, no changes today. 

## 2012-12-15 ENCOUNTER — Other Ambulatory Visit: Payer: Medicare Other

## 2012-12-17 ENCOUNTER — Other Ambulatory Visit (INDEPENDENT_AMBULATORY_CARE_PROVIDER_SITE_OTHER): Payer: Medicare Other

## 2012-12-17 DIAGNOSIS — Z789 Other specified health status: Secondary | ICD-10-CM

## 2012-12-17 DIAGNOSIS — E785 Hyperlipidemia, unspecified: Secondary | ICD-10-CM

## 2012-12-17 LAB — COMPREHENSIVE METABOLIC PANEL
ALT: 18 U/L (ref 0–35)
Albumin: 3.8 g/dL (ref 3.5–5.2)
CO2: 25 mEq/L (ref 19–32)
Calcium: 9.6 mg/dL (ref 8.4–10.5)
Chloride: 107 mEq/L (ref 96–112)
GFR: 76.22 mL/min (ref >60.00)
Glucose, Bld: 117 mg/dL — ABNORMAL HIGH (ref 70–99)
Sodium: 139 mEq/L (ref 135–145)
Total Protein: 6.4 g/dL (ref 6.0–8.3)

## 2012-12-17 LAB — LIPID PANEL: Cholesterol: 258 mg/dL — ABNORMAL HIGH (ref 0–200)

## 2012-12-21 ENCOUNTER — Telehealth: Payer: Self-pay | Admitting: *Deleted

## 2012-12-21 MED ORDER — MONTELUKAST SODIUM 10 MG PO TABS: 10.0000 mg | ORAL_TABLET | Freq: Every day | ORAL | Status: DC

## 2012-12-21 NOTE — Telephone Encounter (Signed)
Patient stated she would like script for Singular mentioned during last visit for allergies,

## 2012-12-21 NOTE — Telephone Encounter (Signed)
singulair sent to pharmacy

## 2012-12-24 DIAGNOSIS — Z789 Other specified health status: Secondary | ICD-10-CM | POA: Insufficient documentation

## 2012-12-25 ENCOUNTER — Ambulatory Visit: Payer: Self-pay | Admitting: Gynecologic Oncology

## 2012-12-25 ENCOUNTER — Ambulatory Visit: Payer: Self-pay | Admitting: Oncology

## 2013-01-05 LAB — PROTIME-INR: INR: 2.8

## 2013-01-19 ENCOUNTER — Ambulatory Visit: Payer: Self-pay | Admitting: Gastroenterology

## 2013-01-20 LAB — PATHOLOGY REPORT

## 2013-01-25 ENCOUNTER — Ambulatory Visit: Payer: Self-pay | Admitting: Oncology

## 2013-02-02 ENCOUNTER — Encounter: Payer: Self-pay | Admitting: *Deleted

## 2013-02-03 ENCOUNTER — Ambulatory Visit (INDEPENDENT_AMBULATORY_CARE_PROVIDER_SITE_OTHER): Payer: Medicare Other | Admitting: Adult Health

## 2013-02-03 ENCOUNTER — Encounter: Payer: Self-pay | Admitting: Adult Health

## 2013-02-03 VITALS — BP 110/60 | HR 68 | Temp 98.5°F | Resp 12 | Ht 65.0 in | Wt 158.0 lb

## 2013-02-03 DIAGNOSIS — G2581 Restless legs syndrome: Secondary | ICD-10-CM

## 2013-02-03 DIAGNOSIS — E1159 Type 2 diabetes mellitus with other circulatory complications: Secondary | ICD-10-CM

## 2013-02-03 DIAGNOSIS — Z8639 Personal history of other endocrine, nutritional and metabolic disease: Secondary | ICD-10-CM

## 2013-02-03 LAB — CBC CANCER CENTER
Basophil #: 0.1 x10 3/mm (ref 0.0–0.1)
Basophil %: 0.9 %
Eosinophil #: 0.1 x10 3/mm (ref 0.0–0.7)
HCT: 35.1 % (ref 35.0–47.0)
HGB: 11.5 g/dL — ABNORMAL LOW (ref 12.0–16.0)
Lymphocyte #: 2.2 x10 3/mm (ref 1.0–3.6)
MCHC: 32.8 g/dL (ref 32.0–36.0)
MCV: 87 fL (ref 80–100)
Monocyte %: 6.7 %
Neutrophil #: 6.9 x10 3/mm — ABNORMAL HIGH (ref 1.4–6.5)
Platelet: 321 x10 3/mm (ref 150–440)
RDW: 16.1 % — ABNORMAL HIGH (ref 11.5–14.5)
WBC: 9.9 x10 3/mm (ref 3.6–11.0)

## 2013-02-03 LAB — COMPREHENSIVE METABOLIC PANEL
Alkaline Phosphatase: 100 U/L (ref 50–136)
Calcium, Total: 9.3 mg/dL (ref 8.5–10.1)
Chloride: 105 mmol/L (ref 98–107)
Creatinine: 1.05 mg/dL (ref 0.60–1.30)
EGFR (African American): >60
EGFR (Non-African Amer.): 55 — ABNORMAL LOW
Potassium: 4.4 mmol/L (ref 3.5–5.1)
SGOT(AST): 15 U/L (ref 15–37)
SGPT (ALT): 23 U/L (ref 12–78)
Sodium: 140 mmol/L (ref 136–145)

## 2013-02-03 LAB — VITAMIN B12: Vitamin B-12: 114 pg/mL — ABNORMAL LOW (ref 211–911)

## 2013-02-03 LAB — PROTIME-INR: INR: 2.8

## 2013-02-03 LAB — FERRITIN: Ferritin: 8 ng/mL — ABNORMAL LOW (ref 10.0–291.0)

## 2013-02-03 MED ORDER — PRAMIPEXOLE DIHYDROCHLORIDE 0.25 MG PO TABS: ORAL_TABLET | ORAL | Status: DC

## 2013-02-03 MED ORDER — PANTOPRAZOLE SODIUM 40 MG PO TBEC: 40.0000 mg | DELAYED_RELEASE_TABLET | Freq: Every day | ORAL | Status: AC

## 2013-02-03 NOTE — Patient Instructions (Addendum)
Restless Legs Syndrome Restless legs syndrome is a movement disorder. It may also be called a sensori-motor disorder.  CAUSES  No one knows what specifically causes restless legs syndrome, but it tends to run in families. It is also more common in people with low iron, in pregnancy, in people who need dialysis, and those with nerve damage (neuropathy).Some medications may make restless legs syndrome worse.Those medications include drugs to treat high blood pressure, some heart conditions, nausea, colds, allergies, and depression. SYMPTOMS Symptoms include uncomfortable sensations in the legs. These leg sensations are worse during periods of inactivity or rest. They are also worse while sitting or lying down. Individuals that have the disorder describe sensations in the legs that feel like:  Pulling.  Drawing.  Crawling.  Worming.  Boring.  Tingling.  Pins and needles.  Prickling.  Pain. The sensations are usually accompanied by an overwhelming urge to move the legs. Sudden muscle jerks may also occur. Movement provides temporary relief from the discomfort. In rare cases, the arms may also be affected. Symptoms may interfere with going to sleep (sleep onset insomnia). Restless legs syndrome may also be related to periodic limb movement disorder (PLMD). PLMD is another more common motor disorder. It also causes interrupted sleep. The symptoms from PLMD usually occur most often when you are awake. TREATMENT  Treatment for restless legs syndrome is symptomatic. This means that the symptoms are treated.   Massage and cold compresses may provide temporary relief.  Walk, stretch, or take a cold or hot bath.  Get regular exercise and a good night's sleep.  Avoid caffeine, alcohol, nicotine, and medications that can make it worse.  Do activities that provide mental stimulation like discussions, needlework, and video games. These may be helpful if you are not able to walk or  stretch. Some medications are effective in relieving the symptoms. However, many of these medications have side effects. Ask your caregiver about medications that may help your symptoms. Correcting iron deficiency may improve symptoms for some patients. Document Released: 05/03/2002 Document Revised: 08/05/2011 Document Reviewed: 08/09/2010 Exit[employer] Patient Information 2014 [employer].  

## 2013-02-03 NOTE — Assessment & Plan Note (Signed)
Check ferritin. Will also check B12 with h/o deficiency in the past. Start Mirapex 0.125 mg at HS

## 2013-02-03 NOTE — Progress Notes (Signed)
  Subjective:    Patient ID: Janet Arnold, female    DOB: 07/08/1945, 67 y.o.   MRN: [id number]  HPI  Patient presents with RLS for the past 3 weeks. These are occuring nightly and she reports lasting for 2 hours. She reports symptoms have been ongoing for over a year but they have recently become worse. She has a history of B12 deficiency where she was receiving B12 injections. She has not been doing this lately. She is feeling slightly fatigued. No other symptoms at this time.  Current Outpatient Prescriptions on File Prior to Visit  Medication Sig Dispense Refill  . albuterol (PROVENTIL HFA;VENTOLIN HFA) 108 (90 BASE) MCG/ACT inhaler Inhale 2 puffs into the lungs every 6 (six) hours as needed for wheezing.  3.7 g  4  . amLODipine (NORVASC) 10 MG tablet TAKE 1 TABLET BY MOUTH DAILY  30 tablet  5  . clonazePAM (KLONOPIN) 0.5 MG tablet Take 1 tablet (0.5 mg total) by mouth 2 (two) times daily as needed for anxiety.  60 tablet  1  . escitalopram (LEXAPRO) 10 MG tablet Take 1 tablet (10 mg total) by mouth daily.  30 tablet  3  . glimepiride (AMARYL) 2 MG tablet TAKE 1 TABLET BY MOUTH TWICE A DAY  60 tablet  11  . metoprolol (LOPRESSOR) 100 MG tablet TAKE 1 TABLET (100 MG TOTAL) BY MOUTH 2 (TWO) TIMES DAILY.  60 tablet  5  . montelukast (SINGULAIR) 10 MG tablet Take 1 tablet (10 mg total) by mouth at bedtime.  30 tablet  3  . Oxycodone HCl 10 MG TABS as needed.       . Saxagliptin-Metformin (KOMBIGLYZE XR) 2.09-998 MG TB24 TAKE 1 TABLET BY MOUTH DAILY AFTER BREAKFAST.  30 tablet  1  . warfarin (COUMADIN) 5 MG tablet 7.5 mg daily.       Marland Kitchen zolpidem (AMBIEN) 5 MG tablet Take 1 tablet (5 mg total) by mouth at bedtime as needed for sleep.  30 tablet  3  . nitroGLYCERIN (NITROSTAT) 0.4 MG SL tablet Place 1 tablet (0.4 mg total) under the tongue every 5 (five) minutes as needed.  25 tablet  6   No current facility-administered medications on file prior to visit.     Review of Systems  Positive for  restless legs at night. Unable to sleep. Positive for tingling in lower extremities  BP 110/60  Pulse 68  Temp(Src) 98.5 F (36.9 C) (Oral)  Resp 12  Ht 5\' 5"  (1.651 m)  Wt 158 lb (71.668 kg)  BMI 26.29 kg/m2  SpO2 96%     Objective:   Physical Exam  Constitutional: She is oriented to person, place, and time.  Musculoskeletal: Normal range of motion. She exhibits no edema and no tenderness.  Neurological: She is alert and oriented to person, place, and time.  Intact sensation  Psychiatric: She has a normal mood and affect. Her behavior is normal. Judgment and thought content normal.          Assessment & Plan:

## 2013-02-04 LAB — CA 125: CA 125: 607.5 U/mL — ABNORMAL HIGH (ref 0.0–34.0)

## 2013-02-08 ENCOUNTER — Other Ambulatory Visit: Payer: Self-pay | Admitting: Internal Medicine

## 2013-02-10 ENCOUNTER — Encounter: Payer: Self-pay | Admitting: *Deleted

## 2013-02-10 ENCOUNTER — Other Ambulatory Visit: Payer: Self-pay | Admitting: Internal Medicine

## 2013-02-11 ENCOUNTER — Telehealth: Payer: Self-pay | Admitting: *Deleted

## 2013-02-11 NOTE — Telephone Encounter (Signed)
B12 is necessary and will not interfere with her chemo.

## 2013-02-11 NOTE — Telephone Encounter (Signed)
Patient called and stated she will set up an appointment for B12 injections on 02/12/13 when she comes to pick up script . Patient also ask if something could be called in for thrush in her mouth and also stated her cancer has come back and can she take the B12 with the chemo.?

## 2013-02-12 ENCOUNTER — Ambulatory Visit (INDEPENDENT_AMBULATORY_CARE_PROVIDER_SITE_OTHER): Payer: Medicare Other | Admitting: *Deleted

## 2013-02-12 ENCOUNTER — Encounter: Payer: Self-pay | Admitting: *Deleted

## 2013-02-12 DIAGNOSIS — E538 Deficiency of other specified B group vitamins: Secondary | ICD-10-CM

## 2013-02-12 MED ORDER — CYANOCOBALAMIN 1000 MCG/ML IJ SOLN
1000.0000 ug | Freq: Once | INTRAMUSCULAR | Status: AC
  Administered 2013-02-12: 1000 ug via INTRAMUSCULAR

## 2013-02-12 NOTE — Telephone Encounter (Signed)
Labs and order given to pt

## 2013-02-12 NOTE — Telephone Encounter (Signed)
Spoke with pt, would like to get B12 injections at [facility], has approval from oncologist, but will get first one here today when she picks up her Rx for Clonazepam. Needs copy of labs and order for [facility].

## 2013-02-15 LAB — BASIC METABOLIC PANEL
Chloride: 107 mmol/L (ref 98–107)
EGFR (African American): >60
Glucose: 89 mg/dL (ref 65–99)
Osmolality: 274 (ref 275–301)
Sodium: 137 mmol/L (ref 136–145)

## 2013-02-15 LAB — CBC CANCER CENTER
Basophil #: 0.1 x10 3/mm (ref 0.0–0.1)
Basophil %: 0.8 %
Eosinophil #: 0.1 x10 3/mm (ref 0.0–0.7)
Eosinophil %: 0.6 %
HGB: 10.2 g/dL — ABNORMAL LOW (ref 12.0–16.0)
Lymphocyte %: 14.1 %
MCH: 28.5 pg (ref 26.0–34.0)
MCHC: 33 g/dL (ref 32.0–36.0)
MCV: 87 fL (ref 80–100)
Neutrophil %: 73.5 %
Platelet: 329 x10 3/mm (ref 150–440)
WBC: 9.3 x10 3/mm (ref 3.6–11.0)

## 2013-02-15 LAB — PROTIME-INR
INR: 3.7
Prothrombin Time: 35.2 secs — ABNORMAL HIGH (ref 11.5–14.7)

## 2013-02-22 LAB — CBC CANCER CENTER
Basophil #: 0.1 x10 3/mm (ref 0.0–0.1)
Lymphocyte #: 1.6 x10 3/mm (ref 1.0–3.6)
Lymphocyte %: 19.8 %
MCH: 28 pg (ref 26.0–34.0)
MCHC: 32.7 g/dL (ref 32.0–36.0)
Monocyte #: 0.3 x10 3/mm (ref 0.2–0.9)
Monocyte %: 4 %
Neutrophil #: 6 x10 3/mm (ref 1.4–6.5)
Neutrophil %: 74.4 %
Platelet: 366 x10 3/mm (ref 150–440)

## 2013-02-24 ENCOUNTER — Ambulatory Visit: Payer: Self-pay | Admitting: Oncology

## 2013-03-02 ENCOUNTER — Encounter: Payer: Self-pay | Admitting: Internal Medicine

## 2013-03-05 ENCOUNTER — Other Ambulatory Visit: Payer: Self-pay | Admitting: Internal Medicine

## 2013-03-05 MED ORDER — PRAMIPEXOLE DIHYDROCHLORIDE 0.25 MG PO TABS: 0.2500 mg | ORAL_TABLET | Freq: Every day | ORAL | Status: DC

## 2013-03-05 NOTE — Telephone Encounter (Signed)
The patient is needing this medication (pramipexole (MIRAPEX) 0.25 MG tablet  called into the pharmacy today.

## 2013-03-05 NOTE — Telephone Encounter (Signed)
I don't see in Raquel's notes where she is to increase it to a whole tablet, she advised her to take 0.125 mg, should she increase to 1 tablet, or 0.25 mg?

## 2013-03-05 NOTE — Telephone Encounter (Signed)
She can increase to whole tablet and new rx has been sent

## 2013-03-05 NOTE — Telephone Encounter (Signed)
Pt is saying that she was told to increase her Pramipexole to a whole tablet after taking a half for a couple of nights. She is running out of the medication because the instructions says to only take a half before bed but she says Raquel told her to increase it to a whole tablet. Pt was wanting to know if we could call the pharmacy and get that changed for her so she can get that refilled. She is completely out of medication. Pt uses [facility] in [place]. Pt is needing refill on Celexa as well.

## 2013-03-08 LAB — COMPREHENSIVE METABOLIC PANEL
Albumin: 3.3 g/dL — ABNORMAL LOW (ref 3.4–5.0)
Alkaline Phosphatase: 96 U/L (ref 50–136)
Anion Gap: 9 (ref 7–16)
BUN: 21 mg/dL — ABNORMAL HIGH (ref 7–18)
Bilirubin,Total: 0.2 mg/dL (ref 0.2–1.0)
Calcium, Total: 8.4 mg/dL — ABNORMAL LOW (ref 8.5–10.1)
Chloride: 104 mmol/L (ref 98–107)
Co2: 28 mmol/L (ref 21–32)
EGFR (African American): >60
EGFR (Non-African Amer.): 58 — ABNORMAL LOW
Glucose: 95 mg/dL (ref 65–99)
Osmolality: 284 (ref 275–301)
SGOT(AST): 18 U/L (ref 15–37)
SGPT (ALT): 34 U/L (ref 12–78)
Sodium: 141 mmol/L (ref 136–145)
Total Protein: 6.8 g/dL (ref 6.4–8.2)

## 2013-03-08 LAB — CBC CANCER CENTER
Basophil #: 0.1 x10 3/mm (ref 0.0–0.1)
Basophil %: 1.3 %
Eosinophil %: 0.4 %
HCT: 33.9 % — ABNORMAL LOW (ref 35.0–47.0)
HGB: 11.2 g/dL — ABNORMAL LOW (ref 12.0–16.0)
Lymphocyte #: 1.4 x10 3/mm (ref 1.0–3.6)
MCHC: 32.8 g/dL (ref 32.0–36.0)
Monocyte %: 14.3 %
Neutrophil %: 57.2 %
RBC: 3.94 10*6/uL (ref 3.80–5.20)
RDW: 18.5 % — ABNORMAL HIGH (ref 11.5–14.5)

## 2013-03-08 LAB — PROTIME-INR: INR: 2

## 2013-03-15 LAB — CBC CANCER CENTER
Basophil #: 0.1 x10 3/mm (ref 0.0–0.1)
Basophil %: 0.9 %
Eosinophil #: 0 x10 3/mm (ref 0.0–0.7)
HCT: 35.1 % (ref 35.0–47.0)
HGB: 11.6 g/dL — ABNORMAL LOW (ref 12.0–16.0)
Lymphocyte #: 1.7 x10 3/mm (ref 1.0–3.6)
Lymphocyte %: 18.3 %
MCHC: 33 g/dL (ref 32.0–36.0)
Monocyte %: 5.6 %
Neutrophil #: 7.1 x10 3/mm — ABNORMAL HIGH (ref 1.4–6.5)
Neutrophil %: 74.8 %
Platelet: 328 x10 3/mm (ref 150–440)
RBC: 4.09 10*6/uL (ref 3.80–5.20)
RDW: 19.3 % — ABNORMAL HIGH (ref 11.5–14.5)

## 2013-03-22 LAB — CBC CANCER CENTER
Basophil #: 0.1 x10 3/mm (ref 0.0–0.1)
Basophil %: 1.4 %
Eosinophil #: 0 x10 3/mm (ref 0.0–0.7)
Eosinophil %: 0.6 %
MCH: 28.2 pg (ref 26.0–34.0)
MCHC: 32.7 g/dL (ref 32.0–36.0)
MCV: 86 fL (ref 80–100)
Monocyte #: 0.5 x10 3/mm (ref 0.2–0.9)
Neutrophil #: 5.1 x10 3/mm (ref 1.4–6.5)
RDW: 19.3 % — ABNORMAL HIGH (ref 11.5–14.5)
WBC: 7.5 x10 3/mm (ref 3.6–11.0)

## 2013-03-23 LAB — CA 125: CA 125: 182.4 U/mL — ABNORMAL HIGH (ref 0.0–34.0)

## 2013-03-27 ENCOUNTER — Ambulatory Visit: Payer: Self-pay | Admitting: Oncology

## 2013-03-29 LAB — CBC CANCER CENTER
Basophil #: 0.1 x10 3/mm (ref 0.0–0.1)
Basophil %: 0.9 %
Eosinophil #: 0 x10 3/mm (ref 0.0–0.7)
Eosinophil %: 0.8 %
HCT: 33.6 % — ABNORMAL LOW (ref 35.0–47.0)
HGB: 10.9 g/dL — ABNORMAL LOW (ref 12.0–16.0)
Lymphocyte #: 1.4 x10 3/mm (ref 1.0–3.6)
Lymphocyte %: 21.1 %
MCHC: 32.3 g/dL (ref 32.0–36.0)
MCV: 87 fL (ref 80–100)
Monocyte %: 12.5 %
RBC: 3.87 10*6/uL (ref 3.80–5.20)

## 2013-03-29 LAB — COMPREHENSIVE METABOLIC PANEL
Alkaline Phosphatase: 89 U/L (ref 50–136)
Anion Gap: 9 (ref 7–16)
BUN: 11 mg/dL (ref 7–18)
Bilirubin,Total: <0.1 mg/dL — ABNORMAL LOW (ref 0.2–1.0)
Calcium, Total: 8.7 mg/dL (ref 8.5–10.1)
Creatinine: 1.09 mg/dL (ref 0.60–1.30)
EGFR (African American): >60
EGFR (Non-African Amer.): 53 — ABNORMAL LOW
Glucose: 147 mg/dL — ABNORMAL HIGH (ref 65–99)
SGPT (ALT): 35 U/L (ref 12–78)

## 2013-03-29 LAB — PROTIME-INR
INR: 9.6
Prothrombin Time: 71.8 secs — ABNORMAL HIGH (ref 11.5–14.7)

## 2013-04-02 LAB — PROTIME-INR
INR: 1.1
Prothrombin Time: 14.3 secs (ref 11.5–14.7)

## 2013-04-03 ENCOUNTER — Other Ambulatory Visit: Payer: Self-pay | Admitting: Internal Medicine

## 2013-04-05 LAB — PROTIME-INR
INR: 0.9
Prothrombin Time: 12.1 secs (ref 11.5–14.7)

## 2013-04-07 ENCOUNTER — Other Ambulatory Visit: Payer: Self-pay | Admitting: Internal Medicine

## 2013-04-19 LAB — CBC CANCER CENTER
Basophil #: 0 x10 3/mm (ref 0.0–0.1)
Eosinophil %: 0.4 %
Lymphocyte %: 26.4 %
MCH: 28 pg (ref 26.0–34.0)
MCHC: 32.2 g/dL (ref 32.0–36.0)
MCV: 87 fL (ref 80–100)
Monocyte #: 0.8 x10 3/mm (ref 0.2–0.9)
Monocyte %: 13.9 %
Neutrophil #: 3.3 x10 3/mm (ref 1.4–6.5)
Neutrophil %: 58.5 %
Platelet: 319 x10 3/mm (ref 150–440)
RDW: 22.8 % — ABNORMAL HIGH (ref 11.5–14.5)
WBC: 5.7 x10 3/mm (ref 3.6–11.0)

## 2013-04-19 LAB — COMPREHENSIVE METABOLIC PANEL
Albumin: 3.2 g/dL — ABNORMAL LOW (ref 3.4–5.0)
BUN: 19 mg/dL — ABNORMAL HIGH (ref 7–18)
Calcium, Total: 9.2 mg/dL (ref 8.5–10.1)
Chloride: 106 mmol/L (ref 98–107)
Co2: 25 mmol/L (ref 21–32)
Creatinine: 1.07 mg/dL (ref 0.60–1.30)
EGFR (African American): >60
EGFR (Non-African Amer.): 54 — ABNORMAL LOW
Glucose: 120 mg/dL — ABNORMAL HIGH (ref 65–99)
Osmolality: 285 (ref 275–301)
Potassium: 4.5 mmol/L (ref 3.5–5.1)
Sodium: 141 mmol/L (ref 136–145)

## 2013-04-19 LAB — PROTIME-INR: Prothrombin Time: 41.1 secs — ABNORMAL HIGH (ref 11.5–14.7)

## 2013-04-26 ENCOUNTER — Ambulatory Visit: Payer: Self-pay | Admitting: Oncology

## 2013-04-26 LAB — CBC CANCER CENTER
Basophil %: 0.8 %
Eosinophil #: 0 x10 3/mm (ref 0.0–0.7)
HGB: 11.4 g/dL — ABNORMAL LOW (ref 12.0–16.0)
Lymphocyte #: 1.8 x10 3/mm (ref 1.0–3.6)
Lymphocyte %: 19.8 %
MCH: 29.1 pg (ref 26.0–34.0)
MCHC: 33.4 g/dL (ref 32.0–36.0)
MCV: 87 fL (ref 80–100)
Monocyte #: 1 x10 3/mm — ABNORMAL HIGH (ref 0.2–0.9)
Neutrophil #: 6 x10 3/mm (ref 1.4–6.5)
Neutrophil %: 68 %
Platelet: 279 x10 3/mm (ref 150–440)
WBC: 8.9 x10 3/mm (ref 3.6–11.0)

## 2013-04-28 LAB — CBC CANCER CENTER
Basophil #: 0.1 x10 3/mm (ref 0.0–0.1)
Basophil %: 1.7 %
HCT: 32.6 % — ABNORMAL LOW (ref 35.0–47.0)
MCH: 28.3 pg (ref 26.0–34.0)
MCV: 87 fL (ref 80–100)
Monocyte #: 0.8 x10 3/mm (ref 0.2–0.9)
Neutrophil #: 5.1 x10 3/mm (ref 1.4–6.5)
Neutrophil %: 65.6 %
Platelet: 268 x10 3/mm (ref 150–440)

## 2013-04-28 LAB — COMPREHENSIVE METABOLIC PANEL
Albumin: 3 g/dL — ABNORMAL LOW (ref 3.4–5.0)
BUN: 17 mg/dL (ref 7–18)
EGFR (African American): 55 — ABNORMAL LOW
Glucose: 105 mg/dL — ABNORMAL HIGH (ref 65–99)
Osmolality: 283 (ref 275–301)
SGPT (ALT): 35 U/L (ref 12–78)

## 2013-05-04 LAB — CBC CANCER CENTER
Basophil #: 0.1 x10 3/mm (ref 0.0–0.1)
Basophil %: 1.2 %
Eosinophil %: 0.8 %
HCT: 35.7 % (ref 35.0–47.0)
HGB: 11.3 g/dL — ABNORMAL LOW (ref 12.0–16.0)
Lymphocyte #: 1.9 x10 3/mm (ref 1.0–3.6)
Lymphocyte %: 21 %
MCH: 28.2 pg (ref 26.0–34.0)
MCHC: 31.8 g/dL — ABNORMAL LOW (ref 32.0–36.0)
MCV: 89 fL (ref 80–100)
Monocyte #: 1 x10 3/mm — ABNORMAL HIGH (ref 0.2–0.9)
Monocyte %: 10.6 %
Neutrophil #: 6.1 x10 3/mm (ref 1.4–6.5)
Neutrophil %: 66.4 %
RBC: 4.02 10*6/uL (ref 3.80–5.20)

## 2013-05-04 LAB — PROTIME-INR: Prothrombin Time: 58.5 secs — ABNORMAL HIGH (ref 11.5–14.7)

## 2013-05-05 LAB — CA 125: CA 125: 46.4 U/mL — ABNORMAL HIGH (ref 0.0–34.0)

## 2013-05-10 LAB — CBC CANCER CENTER
Basophil #: 0.1 x10 3/mm (ref 0.0–0.1)
Eosinophil %: 1.1 %
HCT: 32.6 % — ABNORMAL LOW (ref 35.0–47.0)
HGB: 10.6 g/dL — ABNORMAL LOW (ref 12.0–16.0)
Lymphocyte %: 22.1 %
MCH: 28.5 pg (ref 26.0–34.0)
MCHC: 32.6 g/dL (ref 32.0–36.0)
Monocyte %: 13.8 %
Neutrophil #: 3.9 x10 3/mm (ref 1.4–6.5)
Neutrophil %: 61.4 %
RBC: 3.72 10*6/uL — ABNORMAL LOW (ref 3.80–5.20)
RDW: 26.1 % — ABNORMAL HIGH (ref 11.5–14.5)

## 2013-05-10 LAB — COMPREHENSIVE METABOLIC PANEL
Alkaline Phosphatase: 89 U/L
Anion Gap: 12 (ref 7–16)
Bilirubin,Total: 0.2 mg/dL (ref 0.2–1.0)
Chloride: 107 mmol/L (ref 98–107)
Co2: 23 mmol/L (ref 21–32)
Creatinine: 1.15 mg/dL (ref 0.60–1.30)
EGFR (African American): 57 — ABNORMAL LOW
EGFR (Non-African Amer.): 50 — ABNORMAL LOW
Glucose: 113 mg/dL — ABNORMAL HIGH (ref 65–99)
Potassium: 4.4 mmol/L (ref 3.5–5.1)
SGOT(AST): 17 U/L (ref 15–37)

## 2013-05-10 LAB — PROTIME-INR
INR: 1.5
Prothrombin Time: 18.1 secs — ABNORMAL HIGH (ref 11.5–14.7)

## 2013-05-21 ENCOUNTER — Other Ambulatory Visit: Payer: Self-pay | Admitting: Adult Health

## 2013-05-21 NOTE — Telephone Encounter (Signed)
According to chart, patient does not take this anymore, was d/c on 9/10. Please advise

## 2013-05-25 ENCOUNTER — Other Ambulatory Visit: Payer: Self-pay | Admitting: *Deleted

## 2013-05-25 LAB — COMPREHENSIVE METABOLIC PANEL
Alkaline Phosphatase: 87 U/L
BUN: 15 mg/dL (ref 7–18)
Calcium, Total: 9 mg/dL (ref 8.5–10.1)
Chloride: 106 mmol/L (ref 98–107)
Co2: 25 mmol/L (ref 21–32)
EGFR (African American): 53 — ABNORMAL LOW
EGFR (Non-African Amer.): 46 — ABNORMAL LOW
Osmolality: 284 (ref 275–301)
Potassium: 4.3 mmol/L (ref 3.5–5.1)
SGOT(AST): 24 U/L (ref 15–37)
SGPT (ALT): 39 U/L (ref 12–78)
Total Protein: 6.9 g/dL (ref 6.4–8.2)

## 2013-05-25 LAB — CBC CANCER CENTER
Basophil #: 0.1 x10 3/mm (ref 0.0–0.1)
Lymphocyte #: 1.5 x10 3/mm (ref 1.0–3.6)
MCH: 28.3 pg (ref 26.0–34.0)
MCHC: 32.3 g/dL (ref 32.0–36.0)
MCV: 88 fL (ref 80–100)
Monocyte %: 8.9 %
Neutrophil %: 70.8 %
RBC: 3.9 10*6/uL (ref 3.80–5.20)
WBC: 8.1 x10 3/mm (ref 3.6–11.0)

## 2013-05-25 LAB — PROTIME-INR
INR: 1.9
Prothrombin Time: 21.5 secs — ABNORMAL HIGH (ref 11.5–14.7)

## 2013-05-26 MED ORDER — PRAMIPEXOLE DIHYDROCHLORIDE 0.25 MG PO TABS: 0.2500 mg | ORAL_TABLET | Freq: Every day | ORAL | Status: DC

## 2013-05-27 ENCOUNTER — Ambulatory Visit: Payer: Self-pay | Admitting: Oncology

## 2013-06-07 LAB — COMPREHENSIVE METABOLIC PANEL
ALBUMIN: 2.9 g/dL — AB (ref 3.4–5.0)
ANION GAP: 10 (ref 7–16)
Alkaline Phosphatase: 97 U/L
BILIRUBIN TOTAL: 0.1 mg/dL — AB (ref 0.2–1.0)
BUN: 14 mg/dL (ref 7–18)
CHLORIDE: 107 mmol/L (ref 98–107)
CREATININE: 1.19 mg/dL (ref 0.60–1.30)
Calcium, Total: 9.2 mg/dL (ref 8.5–10.1)
Co2: 23 mmol/L (ref 21–32)
EGFR (African American): 55 — ABNORMAL LOW
EGFR (Non-African Amer.): 48 — ABNORMAL LOW
Glucose: 137 mg/dL — ABNORMAL HIGH (ref 65–99)
OSMOLALITY: 282 (ref 275–301)
Potassium: 4.8 mmol/L (ref 3.5–5.1)
SGOT(AST): 22 U/L (ref 15–37)
SGPT (ALT): 31 U/L (ref 12–78)
SODIUM: 140 mmol/L (ref 136–145)
Total Protein: 6.9 g/dL (ref 6.4–8.2)

## 2013-06-07 LAB — CBC CANCER CENTER
Basophil #: 0.1 x10 3/mm (ref 0.0–0.1)
Basophil %: 0.9 %
EOS PCT: 0.9 %
Eosinophil #: 0.1 x10 3/mm (ref 0.0–0.7)
HCT: 33.8 % — AB (ref 35.0–47.0)
HGB: 10.6 g/dL — ABNORMAL LOW (ref 12.0–16.0)
Lymphocyte #: 1.8 x10 3/mm (ref 1.0–3.6)
Lymphocyte %: 21.7 %
MCH: 27.4 pg (ref 26.0–34.0)
MCHC: 31.5 g/dL — AB (ref 32.0–36.0)
MCV: 87 fL (ref 80–100)
MONOS PCT: 13.5 %
Monocyte #: 1.1 x10 3/mm — ABNORMAL HIGH (ref 0.2–0.9)
NEUTROS PCT: 63 %
Neutrophil #: 5.1 x10 3/mm (ref 1.4–6.5)
Platelet: 336 x10 3/mm (ref 150–440)
RBC: 3.88 10*6/uL (ref 3.80–5.20)
RDW: 25.1 % — AB (ref 11.5–14.5)
WBC: 8.1 x10 3/mm (ref 3.6–11.0)

## 2013-06-07 LAB — PROTIME-INR
INR: 2.9
PROTHROMBIN TIME: 29.2 s — AB (ref 11.5–14.7)

## 2013-06-08 LAB — CA 125: CA 125: 26.6 U/mL (ref 0.0–34.0)

## 2013-06-24 LAB — COMPREHENSIVE METABOLIC PANEL
ALK PHOS: 88 U/L
ANION GAP: 12 (ref 7–16)
AST: 17 U/L (ref 15–37)
Albumin: 3.2 g/dL — ABNORMAL LOW (ref 3.4–5.0)
BUN: 12 mg/dL (ref 7–18)
CREATININE: 1.31 mg/dL — AB (ref 0.60–1.30)
Calcium, Total: 8.6 mg/dL (ref 8.5–10.1)
Chloride: 106 mmol/L (ref 98–107)
Co2: 21 mmol/L (ref 21–32)
EGFR (African American): 49 — ABNORMAL LOW
GFR CALC NON AF AMER: 42 — AB
GLUCOSE: 138 mg/dL — AB (ref 65–99)
OSMOLALITY: 279 (ref 275–301)
POTASSIUM: 4.6 mmol/L (ref 3.5–5.1)
SGPT (ALT): 24 U/L (ref 12–78)
SODIUM: 139 mmol/L (ref 136–145)
TOTAL PROTEIN: 6.9 g/dL (ref 6.4–8.2)

## 2013-06-24 LAB — CBC CANCER CENTER
Basophil #: 0.1 x10 3/mm (ref 0.0–0.1)
Basophil %: 1.2 %
EOS PCT: 1.5 %
Eosinophil #: 0.1 x10 3/mm (ref 0.0–0.7)
HCT: 29.6 % — ABNORMAL LOW (ref 35.0–47.0)
HGB: 9.3 g/dL — ABNORMAL LOW (ref 12.0–16.0)
Lymphocyte #: 1.2 x10 3/mm (ref 1.0–3.6)
Lymphocyte %: 21.3 %
MCH: 27.3 pg (ref 26.0–34.0)
MCHC: 31.3 g/dL — ABNORMAL LOW (ref 32.0–36.0)
MCV: 87 fL (ref 80–100)
MONO ABS: 0.7 x10 3/mm (ref 0.2–0.9)
MONOS PCT: 11.9 %
NEUTROS PCT: 64.1 %
Neutrophil #: 3.6 x10 3/mm (ref 1.4–6.5)
PLATELETS: 277 x10 3/mm (ref 150–440)
RBC: 3.39 10*6/uL — ABNORMAL LOW (ref 3.80–5.20)
RDW: 25.8 % — AB (ref 11.5–14.5)
WBC: 5.6 x10 3/mm (ref 3.6–11.0)

## 2013-06-24 LAB — PROTIME-INR
INR: 1.6
PROTHROMBIN TIME: 18.5 s — AB (ref 11.5–14.7)

## 2013-06-27 ENCOUNTER — Ambulatory Visit: Payer: Self-pay | Admitting: Oncology

## 2013-06-30 ENCOUNTER — Other Ambulatory Visit: Payer: Self-pay | Admitting: Internal Medicine

## 2013-07-01 LAB — CLOSTRIDIUM DIFFICILE(ARMC)

## 2013-07-08 LAB — CBC CANCER CENTER
Basophil #: 0.1 x10 3/mm (ref 0.0–0.1)
Basophil %: 1.1 %
EOS ABS: 0.1 x10 3/mm (ref 0.0–0.7)
Eosinophil %: 0.9 %
HCT: 25.6 % — ABNORMAL LOW (ref 35.0–47.0)
HGB: 8.1 g/dL — ABNORMAL LOW (ref 12.0–16.0)
LYMPHS PCT: 22 %
Lymphocyte #: 1.7 x10 3/mm (ref 1.0–3.6)
MCH: 26.5 pg (ref 26.0–34.0)
MCHC: 31.7 g/dL — ABNORMAL LOW (ref 32.0–36.0)
MCV: 84 fL (ref 80–100)
MONO ABS: 0.8 x10 3/mm (ref 0.2–0.9)
Monocyte %: 10.5 %
Neutrophil #: 5 x10 3/mm (ref 1.4–6.5)
Neutrophil %: 65.5 %
Platelet: 278 x10 3/mm (ref 150–440)
RBC: 3.05 10*6/uL — ABNORMAL LOW (ref 3.80–5.20)
RDW: 24.8 % — ABNORMAL HIGH (ref 11.5–14.5)
WBC: 7.6 x10 3/mm (ref 3.6–11.0)

## 2013-07-08 LAB — BASIC METABOLIC PANEL
Anion Gap: 12 (ref 7–16)
BUN: 29 mg/dL — AB (ref 7–18)
CALCIUM: 8.1 mg/dL — AB (ref 8.5–10.1)
CO2: 21 mmol/L (ref 21–32)
CREATININE: 1.53 mg/dL — AB (ref 0.60–1.30)
Chloride: 103 mmol/L (ref 98–107)
EGFR (African American): 41 — ABNORMAL LOW
GFR CALC NON AF AMER: 35 — AB
GLUCOSE: 56 mg/dL — AB (ref 65–99)
Osmolality: 275 (ref 275–301)
Potassium: 4.6 mmol/L (ref 3.5–5.1)
SODIUM: 136 mmol/L (ref 136–145)

## 2013-07-09 ENCOUNTER — Other Ambulatory Visit: Payer: Self-pay | Admitting: Urgent Care

## 2013-07-09 ENCOUNTER — Inpatient Hospital Stay: Payer: Self-pay | Admitting: Internal Medicine

## 2013-07-09 LAB — PROTIME-INR
INR: 11.7
INR: 12.4
INR: 3.5
Prothrombin Time: 85.5 secs — ABNORMAL HIGH (ref 11.5–14.7)
Prothrombin Time: 89.2 secs — ABNORMAL HIGH (ref 11.5–14.7)
Prothrombin Time: 34 secs — ABNORMAL HIGH (ref 11.5–14.7)

## 2013-07-09 LAB — CBC WITH DIFFERENTIAL/PLATELET
Basophil #: 0.1 10*3/uL (ref 0.0–0.1)
Basophil %: 0.9 %
EOS PCT: 0.6 %
Eosinophil #: 0.1 10*3/uL (ref 0.0–0.7)
HCT: 23.2 % — ABNORMAL LOW (ref 35.0–47.0)
HGB: 7.7 g/dL — ABNORMAL LOW (ref 12.0–16.0)
Lymphocyte #: 1.1 10*3/uL (ref 1.0–3.6)
Lymphocyte %: 12.6 %
MCH: 27.9 pg (ref 26.0–34.0)
MCHC: 33.3 g/dL (ref 32.0–36.0)
MCV: 84 fL (ref 80–100)
MONOS PCT: 11.4 %
Monocyte #: 1 x10 3/mm — ABNORMAL HIGH (ref 0.2–0.9)
Neutrophil #: 6.7 10*3/uL — ABNORMAL HIGH (ref 1.4–6.5)
Neutrophil %: 74.5 %
Platelet: 261 10*3/uL (ref 150–440)
RBC: 2.77 10*6/uL — ABNORMAL LOW (ref 3.80–5.20)
RDW: 24.6 % — ABNORMAL HIGH (ref 11.5–14.5)
WBC: 9 10*3/uL (ref 3.6–11.0)

## 2013-07-09 LAB — HEMOGLOBIN
HGB: 7.3 g/dL — AB (ref 12.0–16.0)
HGB: 6 g/dL — ABNORMAL LOW (ref 12.0–16.0)

## 2013-07-09 LAB — COMPREHENSIVE METABOLIC PANEL
Albumin: 2.9 g/dL — ABNORMAL LOW (ref 3.4–5.0)
Alkaline Phosphatase: 88 U/L
Anion Gap: 10 (ref 7–16)
BILIRUBIN TOTAL: 0.1 mg/dL — AB (ref 0.2–1.0)
BUN: 26 mg/dL — ABNORMAL HIGH (ref 7–18)
CALCIUM: 7.9 mg/dL — AB (ref 8.5–10.1)
Chloride: 107 mmol/L (ref 98–107)
Co2: 22 mmol/L (ref 21–32)
Creatinine: 1.31 mg/dL — ABNORMAL HIGH (ref 0.60–1.30)
GFR CALC AF AMER: 49 — AB
GFR CALC NON AF AMER: 42 — AB
GLUCOSE: 97 mg/dL (ref 65–99)
Osmolality: 282 (ref 275–301)
POTASSIUM: 4.8 mmol/L (ref 3.5–5.1)
SGOT(AST): 25 U/L (ref 15–37)
SGPT (ALT): 23 U/L (ref 12–78)
SODIUM: 139 mmol/L (ref 136–145)
TOTAL PROTEIN: 6.3 g/dL — AB (ref 6.4–8.2)

## 2013-07-10 LAB — COMPREHENSIVE METABOLIC PANEL
Albumin: 3 g/dL — ABNORMAL LOW (ref 3.4–5.0)
Alkaline Phosphatase: 87 U/L
Anion Gap: 6 — ABNORMAL LOW (ref 7–16)
BILIRUBIN TOTAL: 0.3 mg/dL (ref 0.2–1.0)
BUN: 15 mg/dL (ref 7–18)
CALCIUM: 8.4 mg/dL — AB (ref 8.5–10.1)
CREATININE: 0.99 mg/dL (ref 0.60–1.30)
Chloride: 111 mmol/L — ABNORMAL HIGH (ref 98–107)
Co2: 24 mmol/L (ref 21–32)
EGFR (African American): >60
GFR CALC NON AF AMER: 59 — AB
GLUCOSE: 45 mg/dL — AB (ref 65–99)
Osmolality: 279 (ref 275–301)
Potassium: 3.8 mmol/L (ref 3.5–5.1)
SGOT(AST): 27 U/L (ref 15–37)
SGPT (ALT): 23 U/L (ref 12–78)
SODIUM: 141 mmol/L (ref 136–145)
Total Protein: 6.3 g/dL — ABNORMAL LOW (ref 6.4–8.2)

## 2013-07-10 LAB — CBC WITH DIFFERENTIAL/PLATELET
Basophil #: 0.1 10*3/uL (ref 0.0–0.1)
Basophil %: 1.2 %
Eosinophil #: 0 10*3/uL (ref 0.0–0.7)
Eosinophil %: 0.7 %
HCT: 28 % — AB (ref 35.0–47.0)
HGB: 9.2 g/dL — ABNORMAL LOW (ref 12.0–16.0)
LYMPHS ABS: 1.1 10*3/uL (ref 1.0–3.6)
Lymphocyte %: 15.5 %
MCH: 28 pg (ref 26.0–34.0)
MCHC: 32.9 g/dL (ref 32.0–36.0)
MCV: 85 fL (ref 80–100)
MONOS PCT: 15.6 %
Monocyte #: 1.1 x10 3/mm — ABNORMAL HIGH (ref 0.2–0.9)
NEUTROS ABS: 4.8 10*3/uL (ref 1.4–6.5)
Neutrophil %: 67 %
Platelet: 202 10*3/uL (ref 150–440)
RBC: 3.3 10*6/uL — ABNORMAL LOW (ref 3.80–5.20)
RDW: 19.9 % — ABNORMAL HIGH (ref 11.5–14.5)
WBC: 7.1 10*3/uL (ref 3.6–11.0)

## 2013-07-10 LAB — PROTIME-INR
INR: 2.4
INR: 2.6
INR: 2.6
INR: 2.6
INR: 2.7
PROTHROMBIN TIME: 25.6 s — AB (ref 11.5–14.7)
PROTHROMBIN TIME: 27.1 s — AB (ref 11.5–14.7)
Prothrombin Time: 27.8 secs — ABNORMAL HIGH (ref 11.5–14.7)
Prothrombin Time: 27.5 secs — ABNORMAL HIGH (ref 11.5–14.7)
Prothrombin Time: 26.8 secs — ABNORMAL HIGH (ref 11.5–14.7)

## 2013-07-10 LAB — HEMOGLOBIN
HGB: 9.4 g/dL — ABNORMAL LOW (ref 12.0–16.0)
HGB: 9.2 g/dL — ABNORMAL LOW (ref 12.0–16.0)
HGB: 9.2 g/dL — ABNORMAL LOW (ref 12.0–16.0)

## 2013-07-10 LAB — POTASSIUM
POTASSIUM: 3.6 mmol/L (ref 3.5–5.1)
POTASSIUM: 3.7 mmol/L (ref 3.5–5.1)

## 2013-07-11 LAB — BASIC METABOLIC PANEL
Anion Gap: 7 (ref 7–16)
BUN: 11 mg/dL (ref 7–18)
CREATININE: 0.92 mg/dL (ref 0.60–1.30)
Calcium, Total: 8.4 mg/dL — ABNORMAL LOW (ref 8.5–10.1)
Chloride: 110 mmol/L — ABNORMAL HIGH (ref 98–107)
Co2: 24 mmol/L (ref 21–32)
EGFR (African American): >60
Glucose: 113 mg/dL — ABNORMAL HIGH (ref 65–99)
Osmolality: 281 (ref 275–301)
POTASSIUM: 3.6 mmol/L (ref 3.5–5.1)
Sodium: 141 mmol/L (ref 136–145)

## 2013-07-11 LAB — HEMOGLOBIN
HGB: 9.1 g/dL — ABNORMAL LOW (ref 12.0–16.0)
HGB: 8.8 g/dL — ABNORMAL LOW (ref 12.0–16.0)

## 2013-07-11 LAB — PROTIME-INR
INR: 2.4
PROTHROMBIN TIME: 25.6 s — AB (ref 11.5–14.7)

## 2013-07-12 LAB — HEMOGLOBIN: HGB: 9.3 g/dL — ABNORMAL LOW (ref 12.0–16.0)

## 2013-07-12 LAB — POTASSIUM: POTASSIUM: 3.8 mmol/L (ref 3.5–5.1)

## 2013-07-12 LAB — PROTIME-INR
INR: 2.3
PROTHROMBIN TIME: 24.7 s — AB (ref 11.5–14.7)

## 2013-07-12 LAB — MAGNESIUM: MAGNESIUM: 1.3 mg/dL — AB

## 2013-07-25 ENCOUNTER — Ambulatory Visit: Payer: Self-pay | Admitting: Oncology

## 2013-07-29 LAB — CBC CANCER CENTER
BASOS PCT: 0.7 %
Basophil #: 0.1 x10 3/mm (ref 0.0–0.1)
Eosinophil #: 0.1 x10 3/mm (ref 0.0–0.7)
Eosinophil %: 0.9 %
HCT: 28 % — AB (ref 35.0–47.0)
HGB: 8.8 g/dL — AB (ref 12.0–16.0)
LYMPHS ABS: 1.3 x10 3/mm (ref 1.0–3.6)
Lymphocyte %: 13 %
MCH: 26.4 pg (ref 26.0–34.0)
MCHC: 31.5 g/dL — ABNORMAL LOW (ref 32.0–36.0)
MCV: 84 fL (ref 80–100)
MONO ABS: 0.9 x10 3/mm (ref 0.2–0.9)
Monocyte %: 9 %
NEUTROS PCT: 76.4 %
Neutrophil #: 7.6 x10 3/mm — ABNORMAL HIGH (ref 1.4–6.5)
PLATELETS: 199 x10 3/mm (ref 150–440)
RBC: 3.33 10*6/uL — AB (ref 3.80–5.20)
RDW: 21 % — ABNORMAL HIGH (ref 11.5–14.5)
WBC: 9.9 x10 3/mm (ref 3.6–11.0)

## 2013-07-29 LAB — COMPREHENSIVE METABOLIC PANEL
Albumin: 3.1 g/dL — ABNORMAL LOW (ref 3.4–5.0)
Alkaline Phosphatase: 106 U/L
Anion Gap: 11 (ref 7–16)
BILIRUBIN TOTAL: 0.2 mg/dL (ref 0.2–1.0)
BUN: 19 mg/dL — ABNORMAL HIGH (ref 7–18)
Calcium, Total: 8.2 mg/dL — ABNORMAL LOW (ref 8.5–10.1)
Chloride: 103 mmol/L (ref 98–107)
Co2: 23 mmol/L (ref 21–32)
Creatinine: 1.54 mg/dL — ABNORMAL HIGH (ref 0.60–1.30)
EGFR (African American): 40 — ABNORMAL LOW
GFR CALC NON AF AMER: 35 — AB
Glucose: 86 mg/dL (ref 65–99)
OSMOLALITY: 275 (ref 275–301)
Potassium: 4.3 mmol/L (ref 3.5–5.1)
SGOT(AST): 43 U/L — ABNORMAL HIGH (ref 15–37)
SGPT (ALT): 24 U/L (ref 12–78)
Sodium: 137 mmol/L (ref 136–145)
Total Protein: 6.7 g/dL (ref 6.4–8.2)

## 2013-07-29 LAB — PROTIME-INR
INR: 1
Prothrombin Time: 12.6 secs (ref 11.5–14.7)

## 2013-08-02 ENCOUNTER — Ambulatory Visit: Payer: Self-pay | Admitting: Urgent Care

## 2013-08-14 IMAGING — US US EXTREM LOW VENOUS*L*
1 series · 14 of 24 positions shown · non-contrast
Comparison: none

REASON FOR EXAM: pain, eval for dvt
COMMENTS:

[Series 1: us extrem low venous*left* · 0.08mm/px · 14 of 30 slices shown]
[im 1/30]
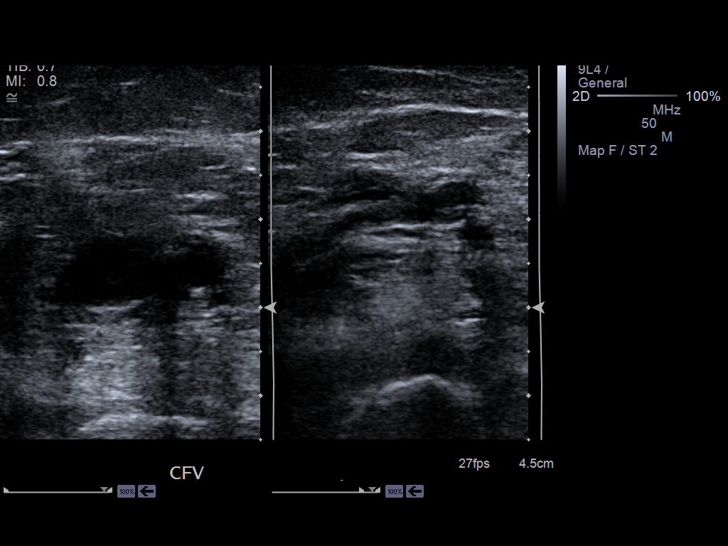
[im 3/30]
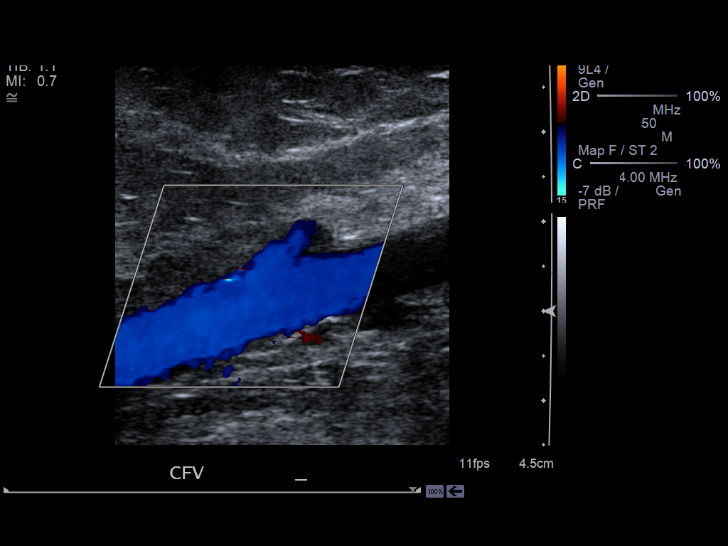
[im 6/30]
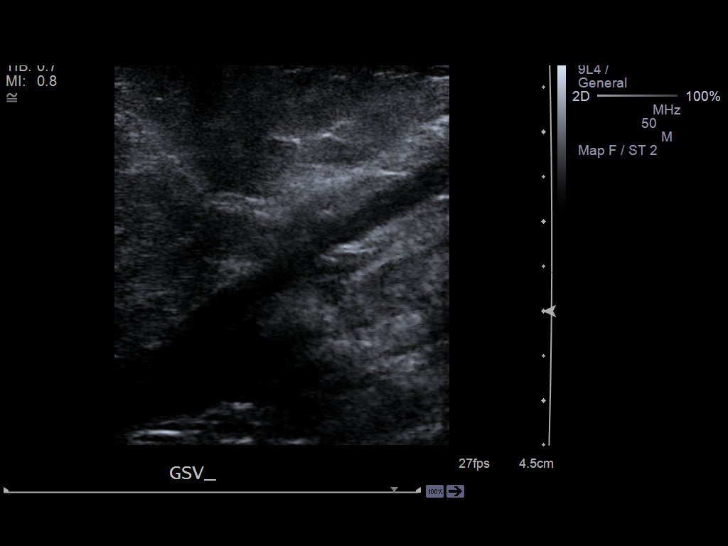
[im 8/30]
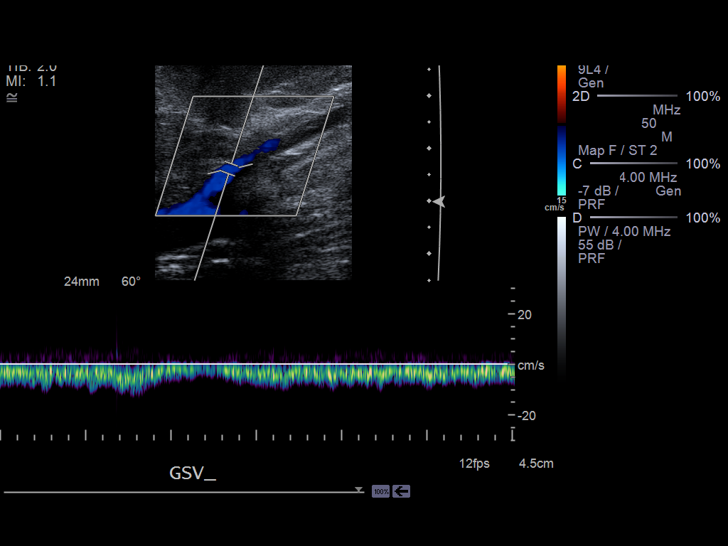
[im 9/30]
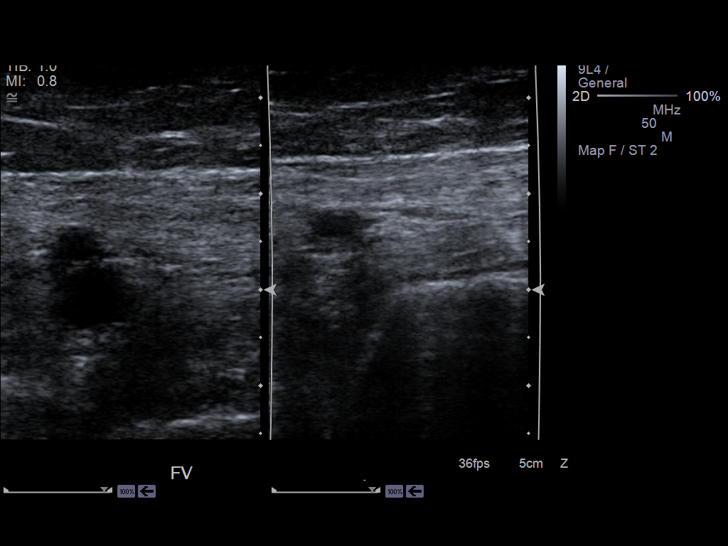
[im 12/30]
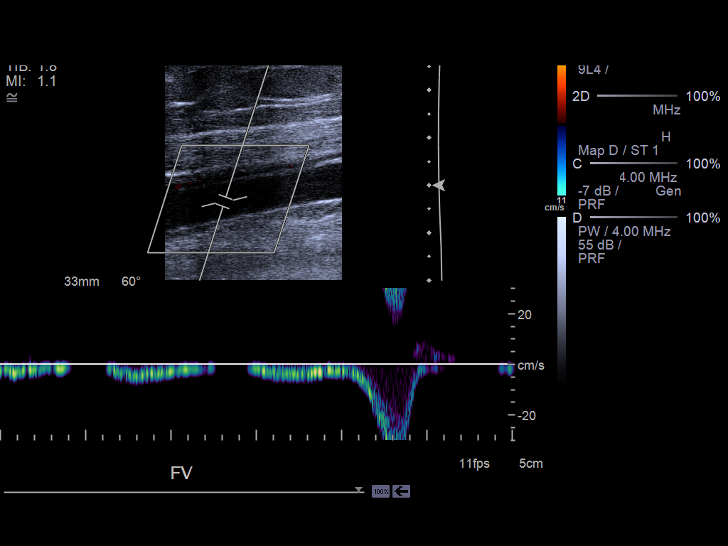
[im 14/30]
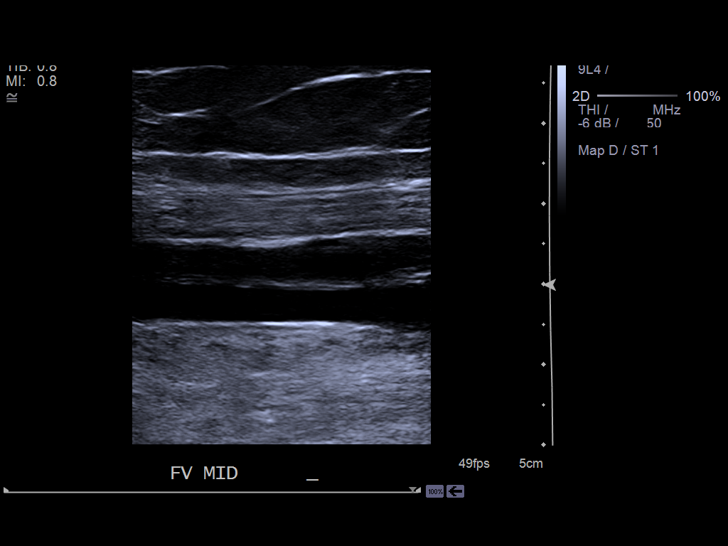
[im 16/30]
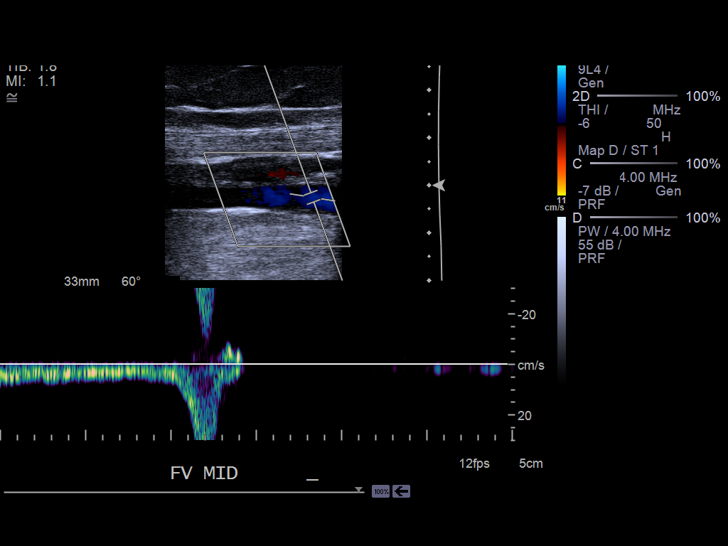
[im 18/30]
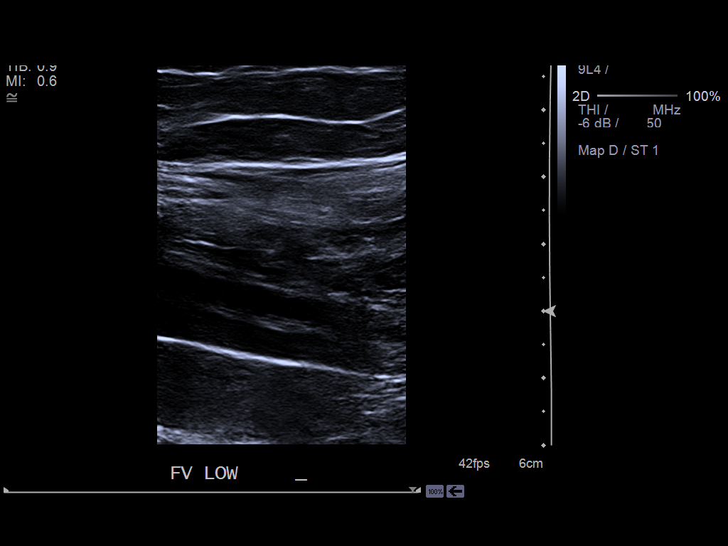
[im 21/30]
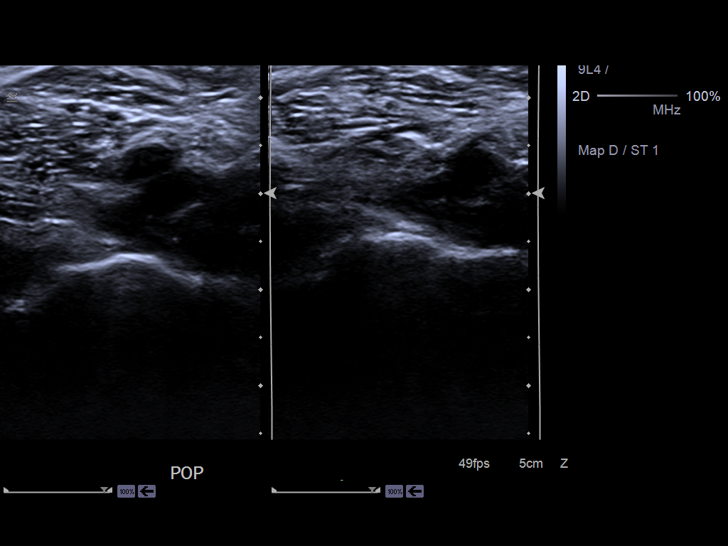
[im 23/30]
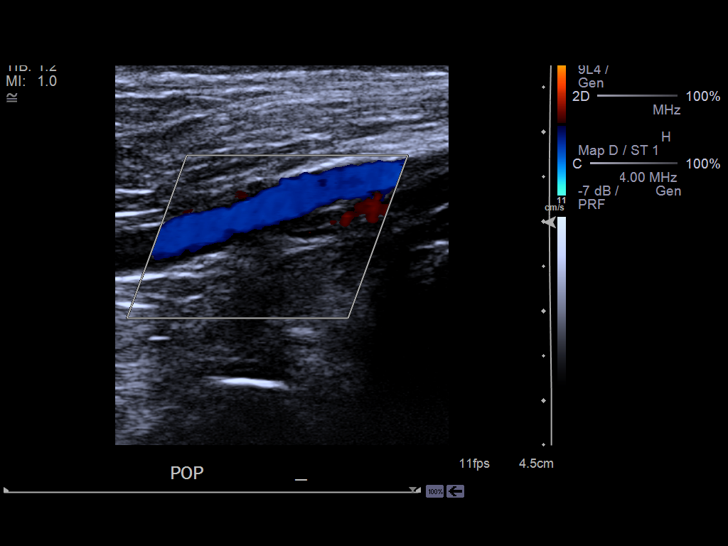
[im 24/30]
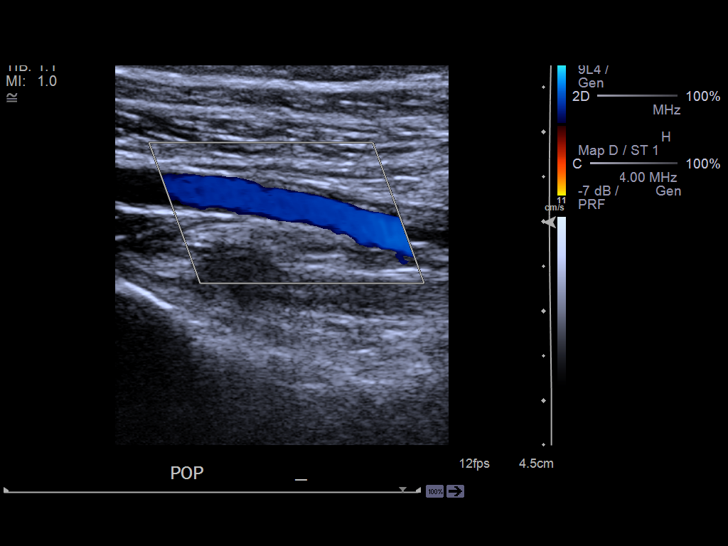
[im 27/30]
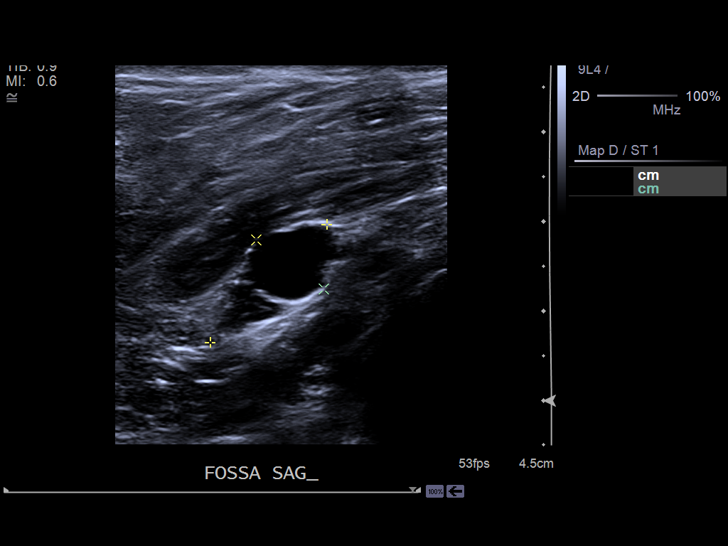
[im 30/30]
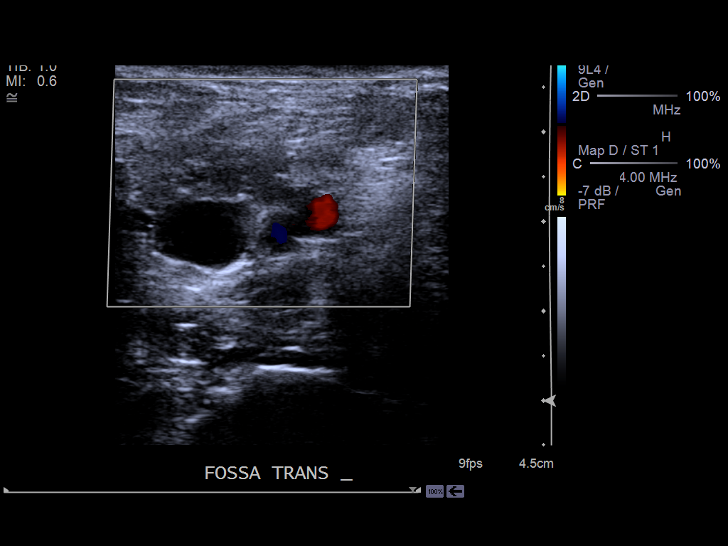

[14 of 24 positions shown; findings below may reference images not displayed]

PROCEDURE:     US  - US DOPPLER LOW EXTR LEFT  - March 25, 2012  [DATE]

RESULT:     Technique: Gray scale, Duplex color flow and SPECTRAL waveform
imaging was performed of the deep venous structures of the LEFT lower
extremity.

There is not evidence of increased echogenicity, non- compressibility,
abnormal waveform or abnormal grayscale flow with the interrogated deep
venous structures of the LEFT lower extremity. There is appropriate response
to Valsalva and augmentation within the interrogated vessels.

A cyst is identified within the popliteal fossa measuring 1.6 x 0.93 a
cm.
IMPRESSION: 1. No sonographic evidence of a deep venous thrombus within the interrogated
vessels of the LEFT lower extremity.

## 2013-08-14 IMAGING — CR DG TIBIA/FIBULA 2V*L*
1 series · 3 of 3 positions shown · non-contrast
Comparison: none

REASON FOR EXAM: pain, eval for fx
COMMENTS:

PROCEDURE:     DXR - DXR TIBIA AND FIBULA LT (LOWER L  - March 25, 2012  [DATE]
RESULT:     There is no evidence of fracture, dislocation, or malalignment.

[Series 3: x tib-fib ap left · 0.14mm/px · 3 of 3 slices shown]
[im 1/3]
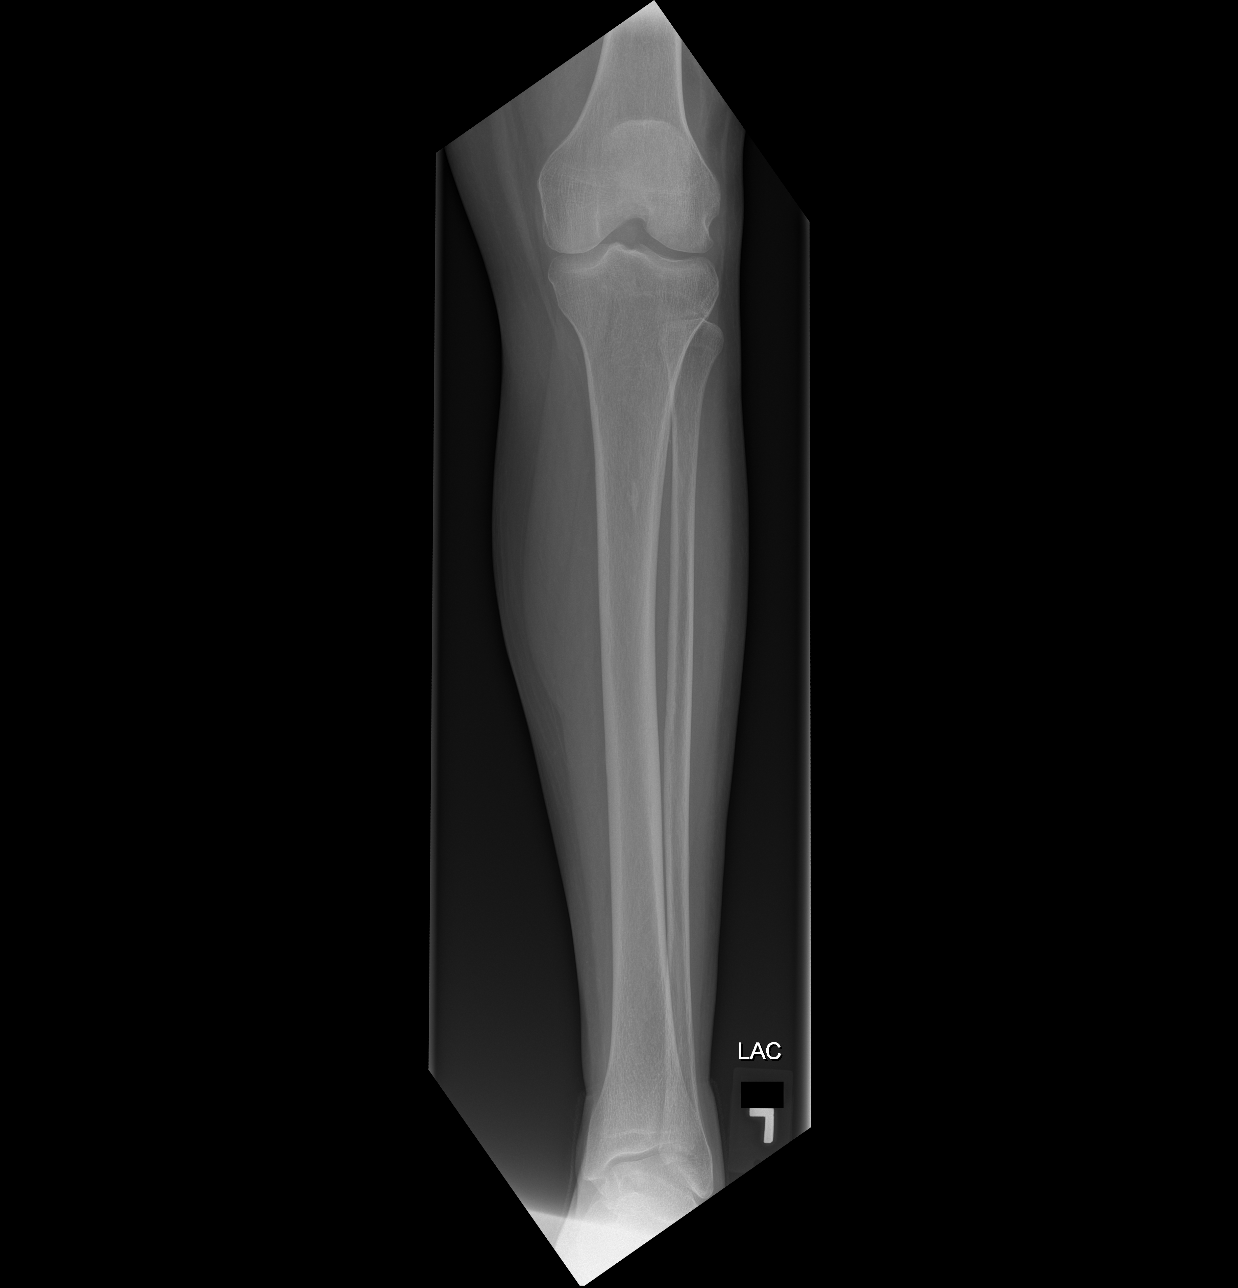
[im 2/3]
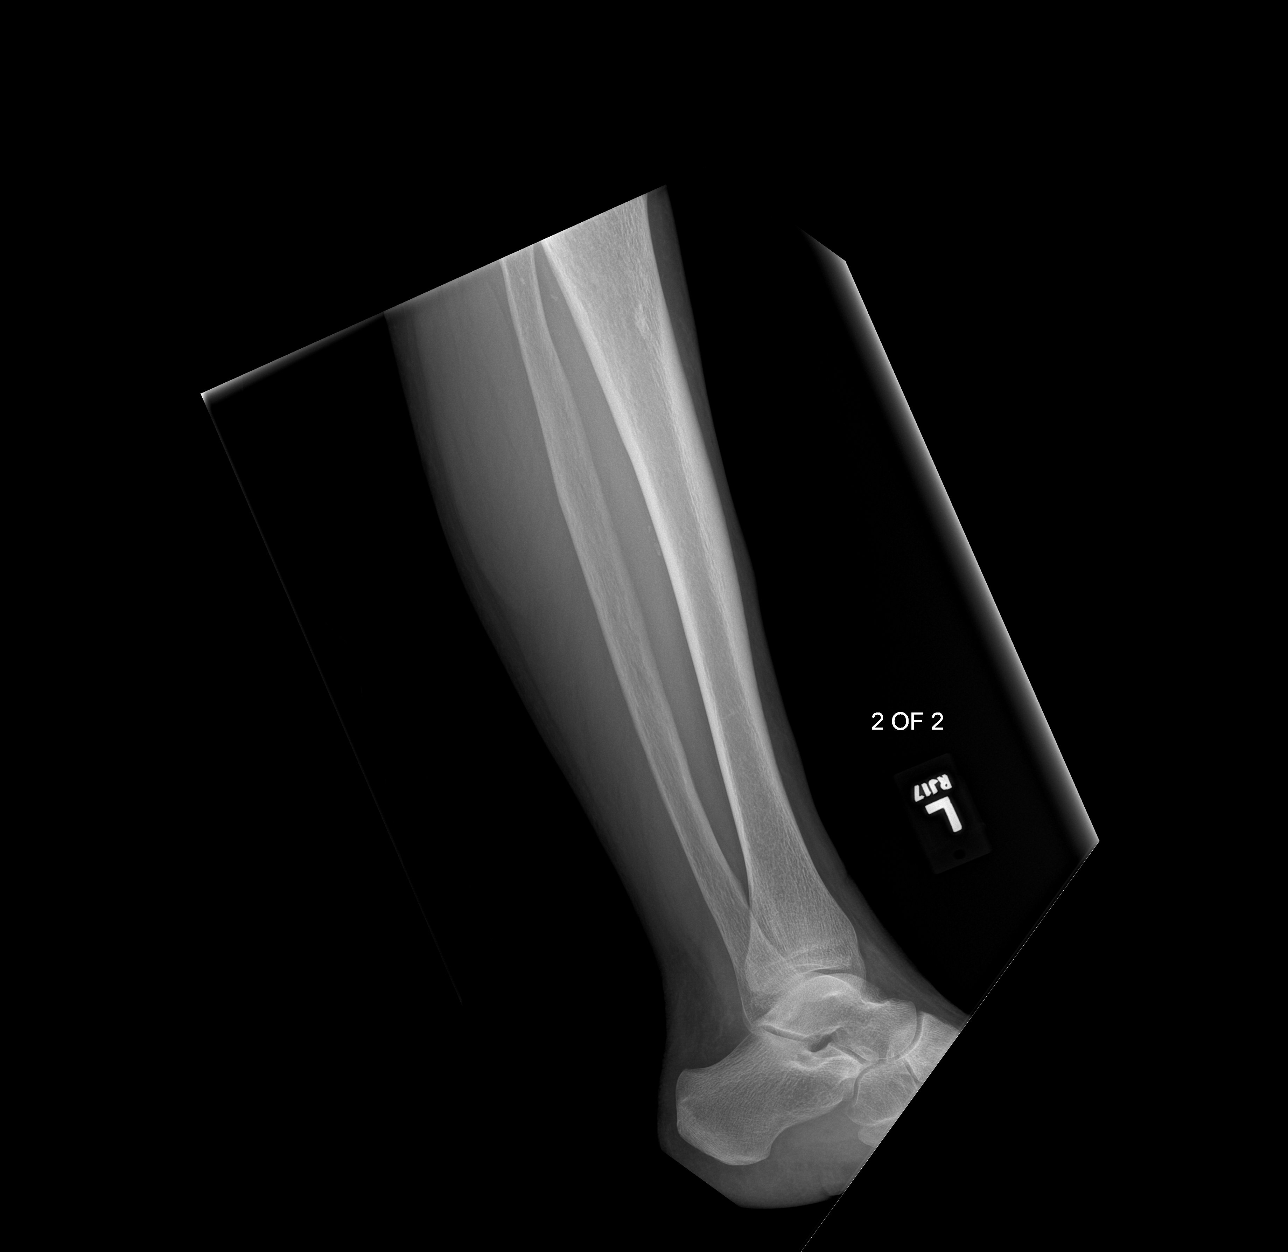
[im 3/3]
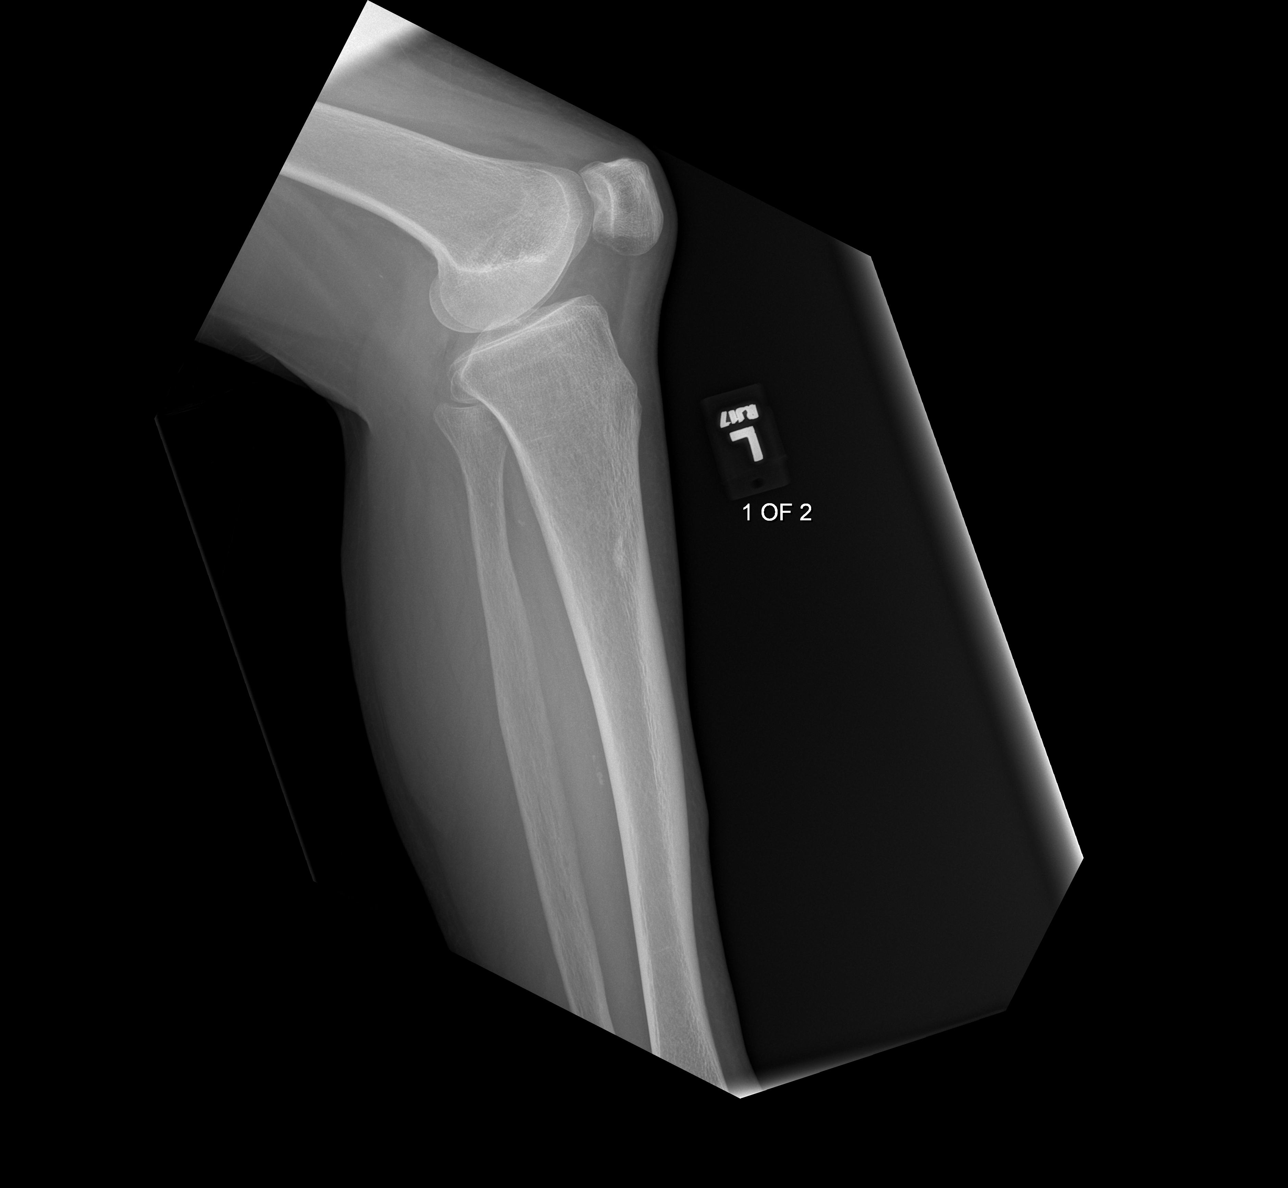

[3 of 3 positions shown; findings below may reference images not displayed]

IMPRESSION: 1. No evidence of acute abnormalities.
2. If there are persistent complaints of pain or persistent clinical
concern, a repeat evaluation in 7-10 days is recommended, if clinically
warranted.

## 2013-08-16 ENCOUNTER — Ambulatory Visit: Payer: Self-pay | Admitting: Vascular Surgery

## 2013-08-16 LAB — BASIC METABOLIC PANEL
ANION GAP: 3 — AB (ref 7–16)
BUN: 19 mg/dL — ABNORMAL HIGH (ref 7–18)
Calcium, Total: 9.4 mg/dL (ref 8.5–10.1)
Chloride: 111 mmol/L — ABNORMAL HIGH (ref 98–107)
Co2: 24 mmol/L (ref 21–32)
Creatinine: 1.2 mg/dL (ref 0.60–1.30)
EGFR (Non-African Amer.): 47 — ABNORMAL LOW
GFR CALC AF AMER: 55 — AB
Glucose: 39 mg/dL — CL (ref 65–99)
Osmolality: 275 (ref 275–301)
Potassium: 4.7 mmol/L (ref 3.5–5.1)
SODIUM: 138 mmol/L (ref 136–145)

## 2013-08-18 ENCOUNTER — Other Ambulatory Visit: Payer: Self-pay | Admitting: Internal Medicine

## 2013-08-18 NOTE — Telephone Encounter (Signed)
Ok to fill 

## 2013-08-23 ENCOUNTER — Telehealth: Payer: Self-pay | Admitting: Internal Medicine

## 2013-08-23 DIAGNOSIS — I739 Peripheral vascular disease, unspecified: Secondary | ICD-10-CM

## 2013-08-23 NOTE — Assessment & Plan Note (Signed)
Angiogram planned for worsening PAD with ulcer.  March 2015

## 2013-08-25 ENCOUNTER — Ambulatory Visit: Payer: Self-pay | Admitting: Oncology

## 2013-08-26 LAB — COMPREHENSIVE METABOLIC PANEL
ALK PHOS: 145 U/L — AB
ALT: 28 U/L (ref 12–78)
AST: 28 U/L (ref 15–37)
Albumin: 3.1 g/dL — ABNORMAL LOW (ref 3.4–5.0)
Anion Gap: 11 (ref 7–16)
BUN: 27 mg/dL — ABNORMAL HIGH (ref 7–18)
Bilirubin,Total: 0.2 mg/dL (ref 0.2–1.0)
CALCIUM: 9 mg/dL (ref 8.5–10.1)
CO2: 23 mmol/L (ref 21–32)
Chloride: 105 mmol/L (ref 98–107)
Creatinine: 1.34 mg/dL — ABNORMAL HIGH (ref 0.60–1.30)
GFR CALC AF AMER: 48 — AB
GFR CALC NON AF AMER: 41 — AB
Glucose: 86 mg/dL (ref 65–99)
Osmolality: 282 (ref 275–301)
Potassium: 4.9 mmol/L (ref 3.5–5.1)
SODIUM: 139 mmol/L (ref 136–145)
Total Protein: 6.8 g/dL (ref 6.4–8.2)

## 2013-08-26 LAB — CBC CANCER CENTER
BASOS ABS: 0.1 x10 3/mm (ref 0.0–0.1)
BASOS PCT: 0.8 %
EOS ABS: 0.3 x10 3/mm (ref 0.0–0.7)
EOS PCT: 3.3 %
HCT: 35.5 % (ref 35.0–47.0)
HGB: 11.2 g/dL — AB (ref 12.0–16.0)
LYMPHS PCT: 10.2 %
Lymphocyte #: 1.1 x10 3/mm (ref 1.0–3.6)
MCH: 28.1 pg (ref 26.0–34.0)
MCHC: 31.5 g/dL — ABNORMAL LOW (ref 32.0–36.0)
MCV: 89 fL (ref 80–100)
MONO ABS: 1 x10 3/mm — AB (ref 0.2–0.9)
Monocyte %: 9.8 %
Neutrophil #: 7.8 x10 3/mm — ABNORMAL HIGH (ref 1.4–6.5)
Neutrophil %: 75.9 %
PLATELETS: 286 x10 3/mm (ref 150–440)
RBC: 3.98 10*6/uL (ref 3.80–5.20)
RDW: 24.8 % — ABNORMAL HIGH (ref 11.5–14.5)
WBC: 10.3 x10 3/mm (ref 3.6–11.0)

## 2013-08-26 LAB — FERRITIN: Ferritin (ARMC): 52 ng/mL (ref 8–388)

## 2013-08-26 LAB — IRON AND TIBC
IRON SATURATION: 9 %
IRON: 27 ug/dL — AB (ref 50–170)
Iron Bind.Cap.(Total): 302 ug/dL (ref 250–450)
Unbound Iron-Bind.Cap.: 275 ug/dL

## 2013-08-27 LAB — CA 125: CA 125: 41.8 U/mL — ABNORMAL HIGH (ref 0.0–34.0)

## 2013-09-03 ENCOUNTER — Other Ambulatory Visit: Payer: Self-pay | Admitting: Internal Medicine

## 2013-09-06 ENCOUNTER — Telehealth: Payer: Self-pay | Admitting: Internal Medicine

## 2013-09-06 ENCOUNTER — Other Ambulatory Visit: Payer: Self-pay | Admitting: *Deleted

## 2013-09-06 MED ORDER — PRAMIPEXOLE DIHYDROCHLORIDE 0.25 MG PO TABS: ORAL_TABLET | ORAL | Status: DC

## 2013-09-06 NOTE — Telephone Encounter (Signed)
pramipexole (MIRAPEX) 0.25 MG tablet

## 2013-09-09 IMAGING — XA IR VASCULAR PROCEDURE
1 series · 1 of 1 positions shown · non-contrast
Comparison: none

[Series 1: single · 1 of 1 slices shown]
[im 1/1]
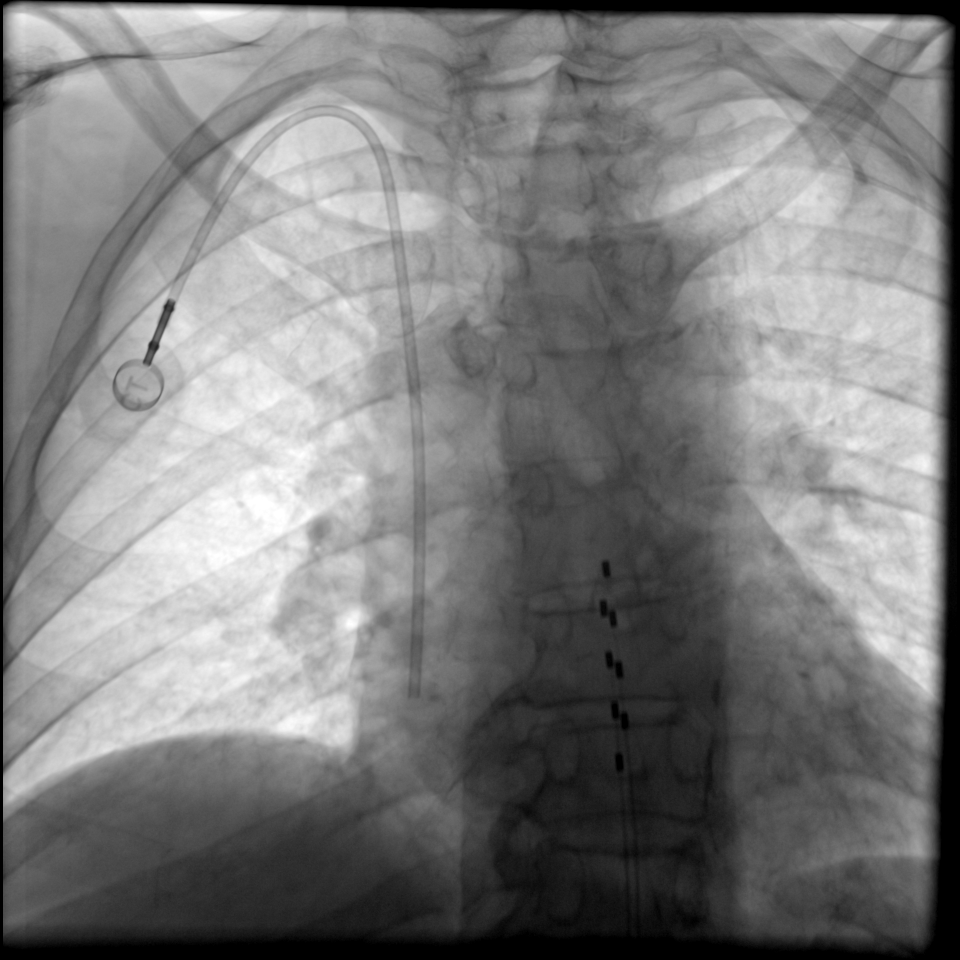

[1 of 1 positions shown; findings below may reference images not displayed]

IMAGES IMPORTED FROM THE SYNGO WORKFLOW SYSTEM
NO DICTATION FOR STUDY

## 2013-09-16 LAB — CBC CANCER CENTER
BASOS PCT: 1.6 %
Basophil #: 0.2 x10 3/mm — ABNORMAL HIGH (ref 0.0–0.1)
EOS PCT: 2.7 %
Eosinophil #: 0.3 x10 3/mm (ref 0.0–0.7)
HCT: 36.4 % (ref 35.0–47.0)
HGB: 11.7 g/dL — ABNORMAL LOW (ref 12.0–16.0)
Lymphocyte #: 1.5 x10 3/mm (ref 1.0–3.6)
Lymphocyte %: 14.8 %
MCH: 27.9 pg (ref 26.0–34.0)
MCHC: 32.1 g/dL (ref 32.0–36.0)
MCV: 87 fL (ref 80–100)
Monocyte #: 0.9 x10 3/mm (ref 0.2–0.9)
Monocyte %: 8.6 %
NEUTROS PCT: 72.3 %
Neutrophil #: 7.4 x10 3/mm — ABNORMAL HIGH (ref 1.4–6.5)
Platelet: 347 x10 3/mm (ref 150–440)
RBC: 4.19 10*6/uL (ref 3.80–5.20)
RDW: 21.6 % — ABNORMAL HIGH (ref 11.5–14.5)
WBC: 10.3 x10 3/mm (ref 3.6–11.0)

## 2013-09-16 LAB — COMPREHENSIVE METABOLIC PANEL
ALK PHOS: 121 U/L — AB
ANION GAP: 14 (ref 7–16)
AST: 13 U/L — AB (ref 15–37)
Albumin: 3.2 g/dL — ABNORMAL LOW (ref 3.4–5.0)
BUN: 24 mg/dL — ABNORMAL HIGH (ref 7–18)
Bilirubin,Total: 0.2 mg/dL (ref 0.2–1.0)
CHLORIDE: 104 mmol/L (ref 98–107)
CO2: 22 mmol/L (ref 21–32)
CREATININE: 1.7 mg/dL — AB (ref 0.60–1.30)
Calcium, Total: 9.6 mg/dL (ref 8.5–10.1)
EGFR (African American): 36 — ABNORMAL LOW
EGFR (Non-African Amer.): 31 — ABNORMAL LOW
GLUCOSE: 119 mg/dL — AB (ref 65–99)
Osmolality: 285 (ref 275–301)
POTASSIUM: 3.7 mmol/L (ref 3.5–5.1)
SGPT (ALT): 14 U/L (ref 12–78)
Sodium: 140 mmol/L (ref 136–145)
Total Protein: 7.2 g/dL (ref 6.4–8.2)

## 2013-09-17 ENCOUNTER — Other Ambulatory Visit: Payer: Self-pay | Admitting: Internal Medicine

## 2013-09-20 ENCOUNTER — Encounter: Payer: Medicare Other | Admitting: Internal Medicine

## 2013-09-21 LAB — CBC CANCER CENTER
BASOS PCT: 0.8 %
Basophil #: 0.1 x10 3/mm (ref 0.0–0.1)
EOS ABS: 0.1 x10 3/mm (ref 0.0–0.7)
Eosinophil %: 1.2 %
HCT: 35 % (ref 35.0–47.0)
HGB: 11.4 g/dL — AB (ref 12.0–16.0)
LYMPHS ABS: 1.4 x10 3/mm (ref 1.0–3.6)
Lymphocyte %: 15 %
MCH: 27.5 pg (ref 26.0–34.0)
MCHC: 32.5 g/dL (ref 32.0–36.0)
MCV: 85 fL (ref 80–100)
Monocyte #: 0.8 x10 3/mm (ref 0.2–0.9)
Monocyte %: 8.3 %
Neutrophil #: 7 x10 3/mm — ABNORMAL HIGH (ref 1.4–6.5)
Neutrophil %: 74.7 %
Platelet: 362 x10 3/mm (ref 150–440)
RBC: 4.14 10*6/uL (ref 3.80–5.20)
RDW: 21.6 % — ABNORMAL HIGH (ref 11.5–14.5)
WBC: 9.4 x10 3/mm (ref 3.6–11.0)

## 2013-09-21 LAB — COMPREHENSIVE METABOLIC PANEL
ALK PHOS: 134 U/L — AB
ALT: 16 U/L (ref 12–78)
AST: 17 U/L (ref 15–37)
Albumin: 3 g/dL — ABNORMAL LOW (ref 3.4–5.0)
Anion Gap: 14 (ref 7–16)
BUN: 40 mg/dL — ABNORMAL HIGH (ref 7–18)
Bilirubin,Total: 0.2 mg/dL (ref 0.2–1.0)
CALCIUM: 9.8 mg/dL (ref 8.5–10.1)
Chloride: 102 mmol/L (ref 98–107)
Co2: 19 mmol/L — ABNORMAL LOW (ref 21–32)
Creatinine: 2.31 mg/dL — ABNORMAL HIGH (ref 0.60–1.30)
GFR CALC AF AMER: 25 — AB
GFR CALC NON AF AMER: 21 — AB
Glucose: 81 mg/dL (ref 65–99)
OSMOLALITY: 279 (ref 275–301)
POTASSIUM: 3.9 mmol/L (ref 3.5–5.1)
SODIUM: 135 mmol/L — AB (ref 136–145)
Total Protein: 7.2 g/dL (ref 6.4–8.2)

## 2013-09-21 IMAGING — CR DG ABDOMEN 3V
1 series · 4 of 4 positions shown · non-contrast
Comparison: none

REASON FOR EXAM: abd pain
COMMENTS:

PROCEDURE:     DXR - DXR ABDOMEN 3-WAY (INCL PA CXR)  - May 02, 2012 [DATE]
RESULT:     Comparison: Chest radiograph 03/04/2012

[Series 1: w chest pa · 0.14mm/px · 4 of 4 slices shown]
[im 1/4]
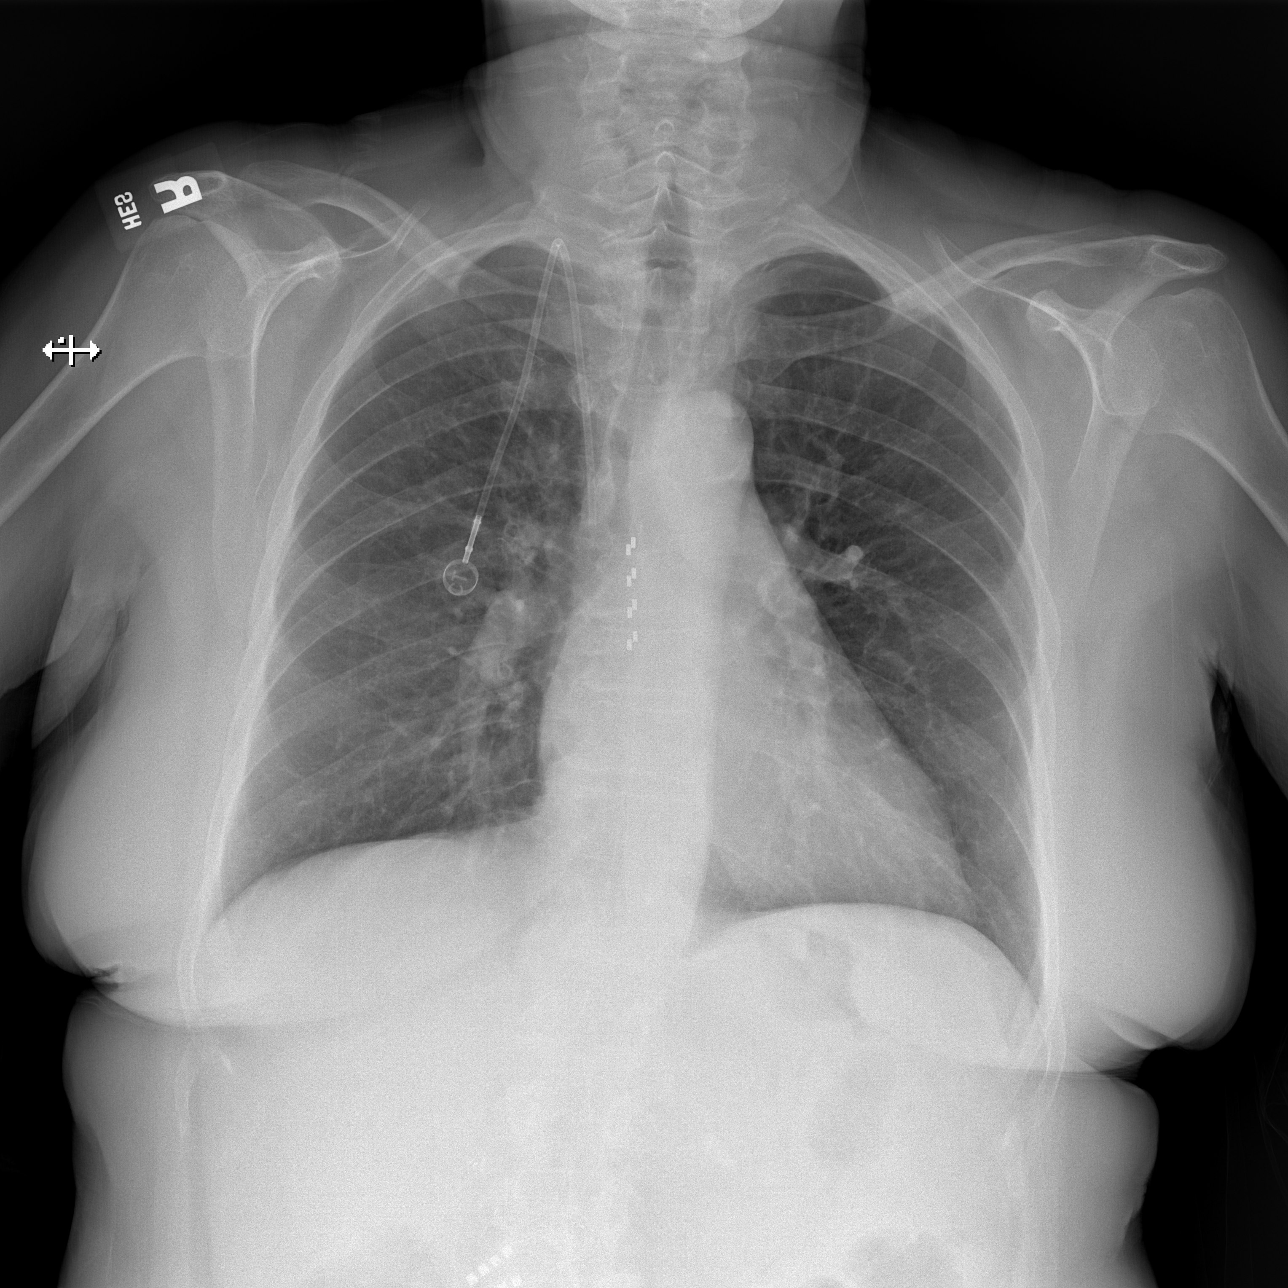
[im 2/4]
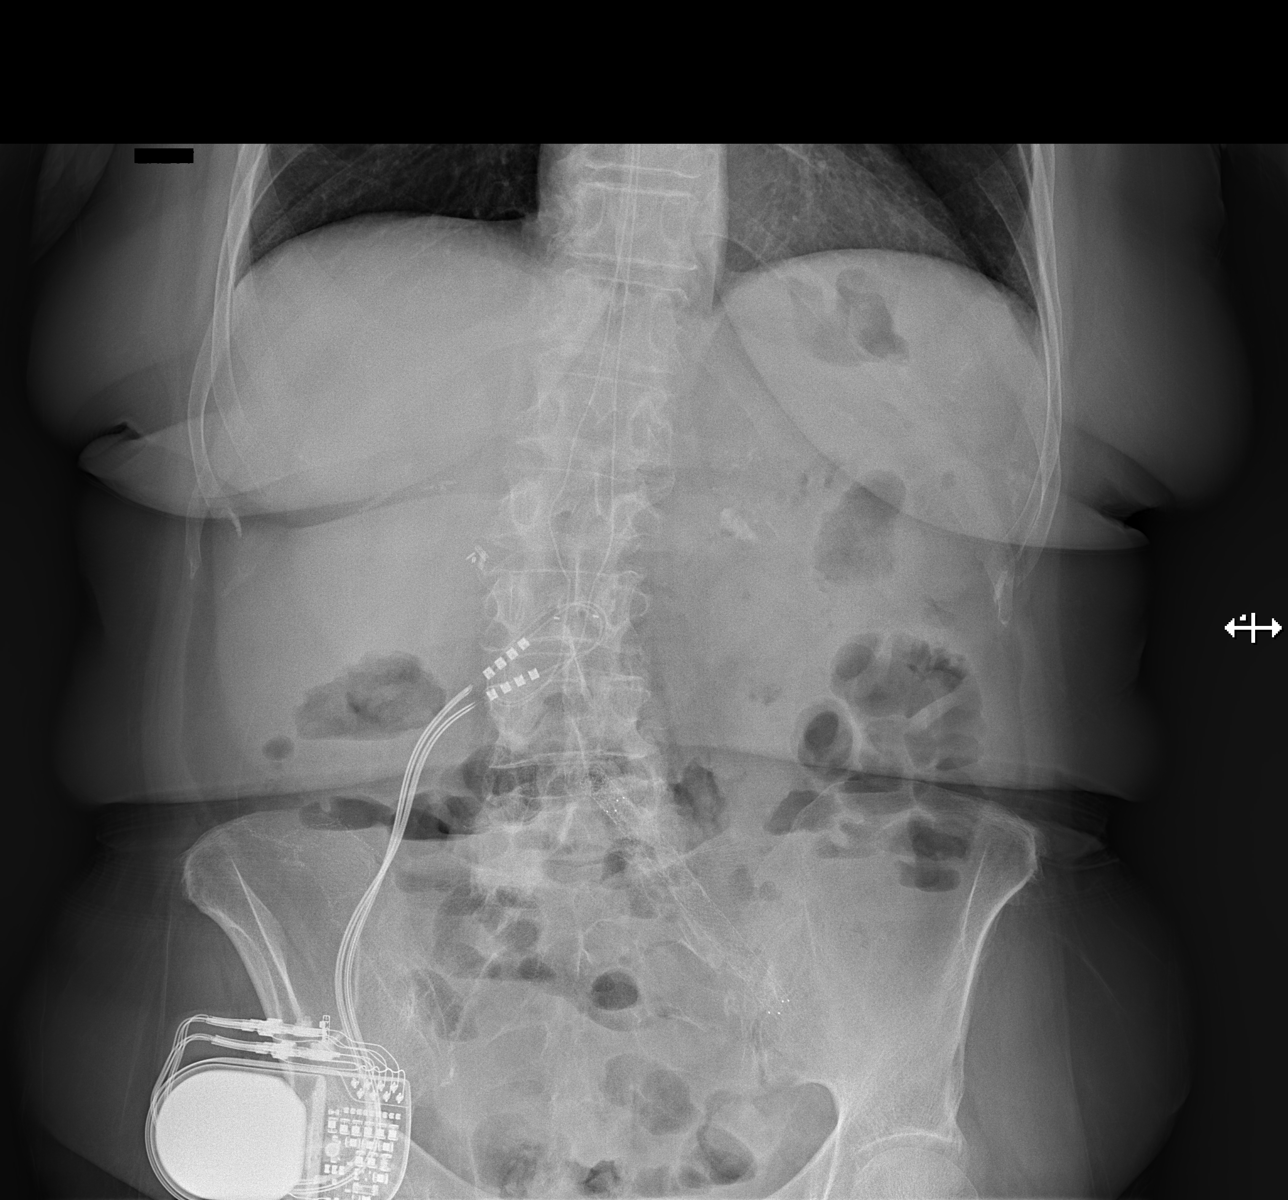
[im 3/4]
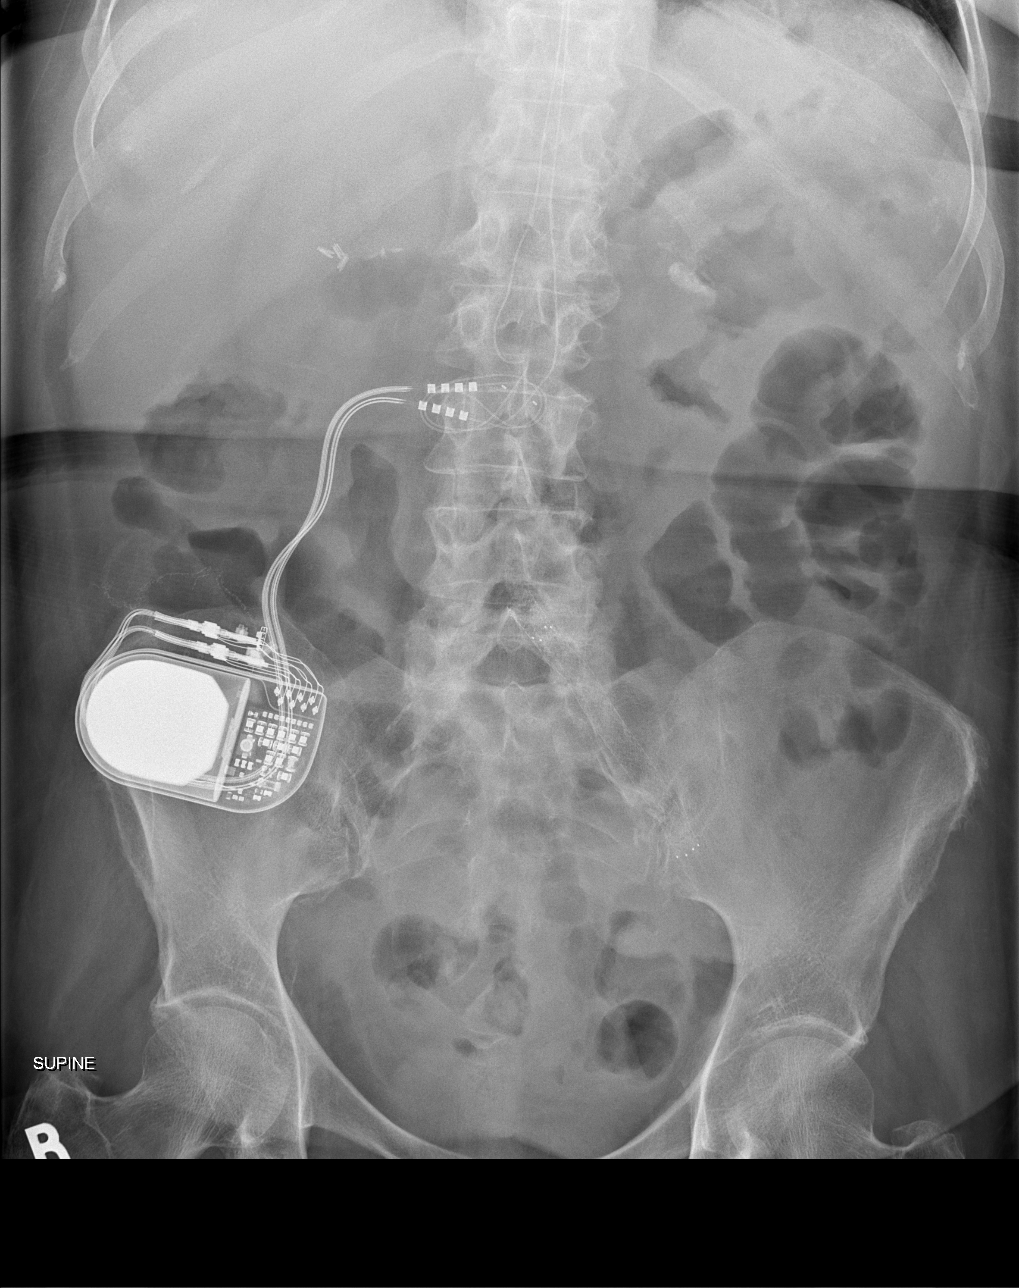
[im 4/4]
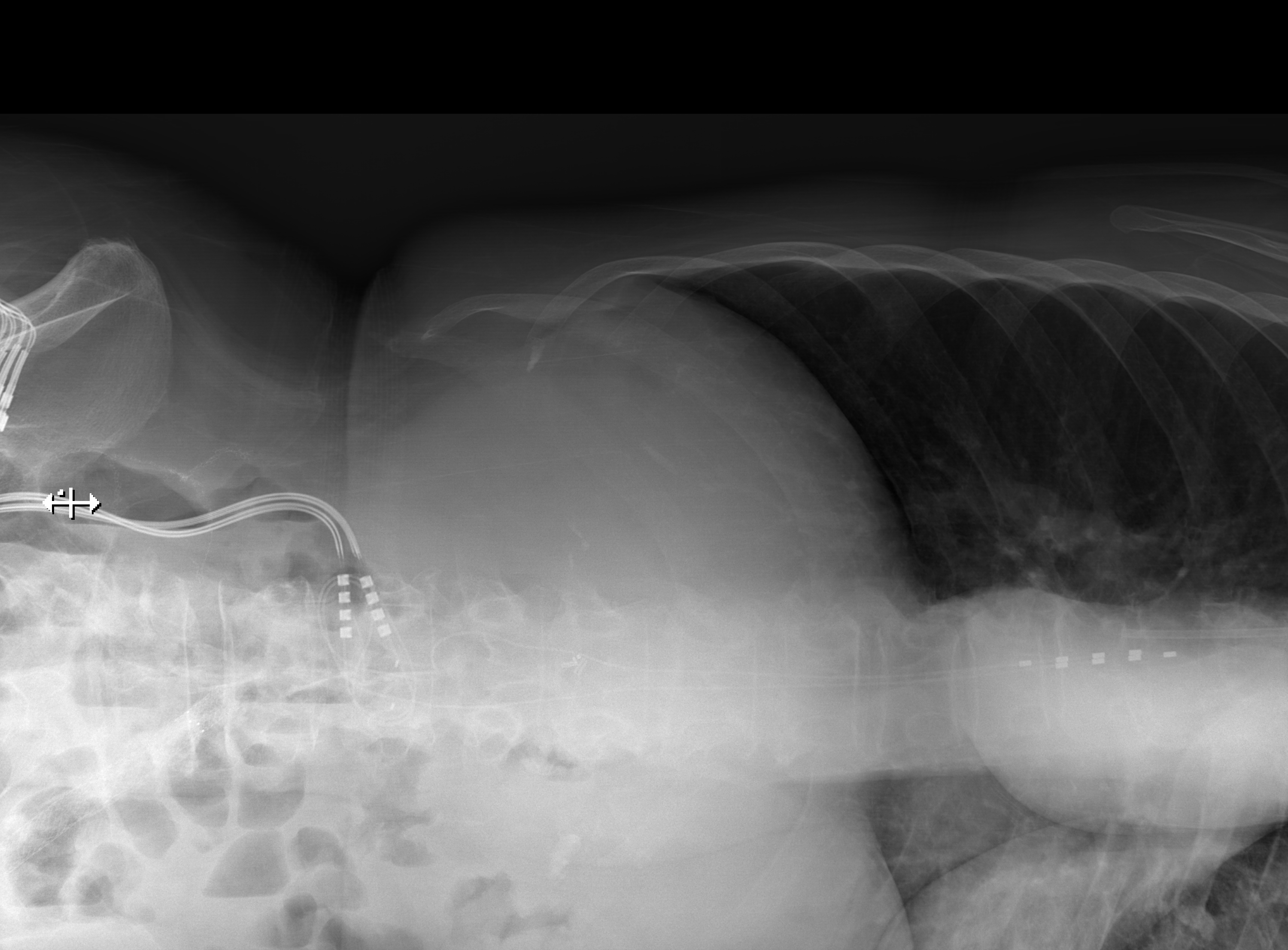

[4 of 4 positions shown; findings below may reference images not displayed]

FINDINGS: Right IJ portacatheter tip terminates over the SVC. The heart and
mediastinum are stable. No focal pulmonary opacities. A spinal stimulator
device overlies the thoracic and lumbar spine. Air is seen within nondilated
small and large bowel. There are a few air-fluid levels on the upright view,
which are nonspecific. Prior cholecystectomy. Calcifications overlying left
hemiabdomen are consistent with the partially calcified mass adjacent to the
left adrenal gland on prior CT. Vascular stent is seen in the region of the
left common iliac artery.
IMPRESSION: Nonobstructed bowel gas pattern.

[REDACTED]

## 2013-09-24 ENCOUNTER — Ambulatory Visit: Payer: Self-pay | Admitting: Oncology

## 2013-10-01 LAB — CA 125: CA 125: 155 U/mL — ABNORMAL HIGH (ref 0.0–34.0)

## 2013-10-08 ENCOUNTER — Encounter: Payer: Self-pay | Admitting: Internal Medicine

## 2013-10-12 LAB — CBC CANCER CENTER
Basophil #: 0.1 x10 3/mm (ref 0.0–0.1)
Basophil %: 2.1 %
EOS PCT: 3.1 %
Eosinophil #: 0.2 x10 3/mm (ref 0.0–0.7)
HCT: 32.4 % — AB (ref 35.0–47.0)
HGB: 10.6 g/dL — ABNORMAL LOW (ref 12.0–16.0)
LYMPHS ABS: 1.3 x10 3/mm (ref 1.0–3.6)
Lymphocyte %: 22.8 %
MCH: 27.2 pg (ref 26.0–34.0)
MCHC: 32.7 g/dL (ref 32.0–36.0)
MCV: 83 fL (ref 80–100)
MONO ABS: 0.8 x10 3/mm (ref 0.2–0.9)
Monocyte %: 13.2 %
Neutrophil #: 3.5 x10 3/mm (ref 1.4–6.5)
Neutrophil %: 58.8 %
Platelet: 396 x10 3/mm (ref 150–440)
RBC: 3.89 10*6/uL (ref 3.80–5.20)
RDW: 21.3 % — ABNORMAL HIGH (ref 11.5–14.5)
WBC: 5.9 x10 3/mm (ref 3.6–11.0)

## 2013-10-12 LAB — COMPREHENSIVE METABOLIC PANEL
ALBUMIN: 2.8 g/dL — AB (ref 3.4–5.0)
ALK PHOS: 124 U/L — AB
ALT: 12 U/L (ref 12–78)
Anion Gap: 10 (ref 7–16)
BILIRUBIN TOTAL: 0.1 mg/dL — AB (ref 0.2–1.0)
BUN: 16 mg/dL (ref 7–18)
Calcium, Total: 8.7 mg/dL (ref 8.5–10.1)
Chloride: 107 mmol/L (ref 98–107)
Co2: 23 mmol/L (ref 21–32)
Creatinine: 1.29 mg/dL (ref 0.60–1.30)
EGFR (Non-African Amer.): 43 — ABNORMAL LOW
GFR CALC AF AMER: 50 — AB
Glucose: 141 mg/dL — ABNORMAL HIGH (ref 65–99)
Osmolality: 283 (ref 275–301)
Potassium: 4 mmol/L (ref 3.5–5.1)
SGOT(AST): 13 U/L — ABNORMAL LOW (ref 15–37)
SODIUM: 140 mmol/L (ref 136–145)
TOTAL PROTEIN: 6.5 g/dL (ref 6.4–8.2)

## 2013-10-13 LAB — CA 125: CA 125: 303.2 U/mL — ABNORMAL HIGH (ref 0.0–34.0)

## 2013-10-19 LAB — CBC CANCER CENTER
BASOS ABS: 0.1 x10 3/mm (ref 0.0–0.1)
Basophil %: 1.7 %
Eosinophil #: 0.1 x10 3/mm (ref 0.0–0.7)
Eosinophil %: 3.4 %
HCT: 32.1 % — ABNORMAL LOW (ref 35.0–47.0)
HGB: 10.2 g/dL — ABNORMAL LOW (ref 12.0–16.0)
LYMPHS PCT: 25.3 %
Lymphocyte #: 1.1 x10 3/mm (ref 1.0–3.6)
MCH: 27.5 pg (ref 26.0–34.0)
MCHC: 31.8 g/dL — ABNORMAL LOW (ref 32.0–36.0)
MCV: 87 fL (ref 80–100)
MONOS PCT: 7 %
Monocyte #: 0.3 x10 3/mm (ref 0.2–0.9)
NEUTROS PCT: 62.6 %
Neutrophil #: 2.7 x10 3/mm (ref 1.4–6.5)
PLATELETS: 303 x10 3/mm (ref 150–440)
RBC: 3.71 10*6/uL — ABNORMAL LOW (ref 3.80–5.20)
RDW: 21.3 % — ABNORMAL HIGH (ref 11.5–14.5)
WBC: 4.3 x10 3/mm (ref 3.6–11.0)

## 2013-10-19 LAB — COMPREHENSIVE METABOLIC PANEL
Albumin: 2.8 g/dL — ABNORMAL LOW (ref 3.4–5.0)
Alkaline Phosphatase: 104 U/L
Anion Gap: 12 (ref 7–16)
BUN: 11 mg/dL (ref 7–18)
Bilirubin,Total: 0.1 mg/dL — ABNORMAL LOW (ref 0.2–1.0)
CREATININE: 1.28 mg/dL (ref 0.60–1.30)
Calcium, Total: 8.5 mg/dL (ref 8.5–10.1)
Chloride: 107 mmol/L (ref 98–107)
Co2: 23 mmol/L (ref 21–32)
EGFR (Non-African Amer.): 43 — ABNORMAL LOW
GFR CALC AF AMER: 50 — AB
Glucose: 121 mg/dL — ABNORMAL HIGH (ref 65–99)
Osmolality: 284 (ref 275–301)
POTASSIUM: 3.7 mmol/L (ref 3.5–5.1)
SGOT(AST): 11 U/L — ABNORMAL LOW (ref 15–37)
SGPT (ALT): 15 U/L (ref 12–78)
Sodium: 142 mmol/L (ref 136–145)
Total Protein: 6.5 g/dL (ref 6.4–8.2)

## 2013-10-20 LAB — CA 125: CA 125: 280.5 U/mL — AB (ref 0.0–34.0)

## 2013-10-25 ENCOUNTER — Other Ambulatory Visit: Payer: Self-pay | Admitting: Internal Medicine

## 2013-10-25 ENCOUNTER — Ambulatory Visit: Payer: Self-pay | Admitting: Oncology

## 2013-10-25 NOTE — Telephone Encounter (Signed)
Refill? Cancelled last appt, last non acute 02/03/13

## 2013-10-26 LAB — CBC CANCER CENTER
BASOS PCT: 2.9 %
Basophil #: 0.1 x10 3/mm (ref 0.0–0.1)
EOS ABS: 0.2 x10 3/mm (ref 0.0–0.7)
EOS PCT: 3.6 %
HCT: 34.7 % — AB (ref 35.0–47.0)
HGB: 11.1 g/dL — ABNORMAL LOW (ref 12.0–16.0)
Lymphocyte #: 1.4 x10 3/mm (ref 1.0–3.6)
Lymphocyte %: 27.8 %
MCH: 27.7 pg (ref 26.0–34.0)
MCHC: 32.1 g/dL (ref 32.0–36.0)
MCV: 86 fL (ref 80–100)
MONO ABS: 0.3 x10 3/mm (ref 0.2–0.9)
Monocyte %: 5.8 %
NEUTROS PCT: 59.9 %
Neutrophil #: 3.1 x10 3/mm (ref 1.4–6.5)
Platelet: 309 x10 3/mm (ref 150–440)
RBC: 4.02 10*6/uL (ref 3.80–5.20)
RDW: 21.3 % — ABNORMAL HIGH (ref 11.5–14.5)
WBC: 5.1 x10 3/mm (ref 3.6–11.0)

## 2013-10-26 LAB — COMPREHENSIVE METABOLIC PANEL
ALBUMIN: 3.4 g/dL (ref 3.4–5.0)
ALK PHOS: 96 U/L
ANION GAP: 13 (ref 7–16)
AST: 17 U/L (ref 15–37)
BILIRUBIN TOTAL: 0.1 mg/dL — AB (ref 0.2–1.0)
BUN: 15 mg/dL (ref 7–18)
CHLORIDE: 105 mmol/L (ref 98–107)
CO2: 23 mmol/L (ref 21–32)
CREATININE: 1.37 mg/dL — AB (ref 0.60–1.30)
Calcium, Total: 9 mg/dL (ref 8.5–10.1)
EGFR (Non-African Amer.): 40 — ABNORMAL LOW
GFR CALC AF AMER: 46 — AB
Glucose: 117 mg/dL — ABNORMAL HIGH (ref 65–99)
Osmolality: 283 (ref 275–301)
Potassium: 3.7 mmol/L (ref 3.5–5.1)
SGPT (ALT): 19 U/L (ref 12–78)
Sodium: 141 mmol/L (ref 136–145)
TOTAL PROTEIN: 6.9 g/dL (ref 6.4–8.2)

## 2013-10-26 NOTE — Telephone Encounter (Signed)
Ok to refill,  Refill sent  

## 2013-10-27 LAB — CA 125: CA 125: 277 U/mL — ABNORMAL HIGH (ref 0.0–34.0)

## 2013-11-04 LAB — BASIC METABOLIC PANEL
Anion Gap: 13 (ref 7–16)
BUN: 38 mg/dL — AB (ref 7–18)
CALCIUM: 9.7 mg/dL (ref 8.5–10.1)
CHLORIDE: 103 mmol/L (ref 98–107)
CO2: 22 mmol/L (ref 21–32)
Creatinine: 2.18 mg/dL — ABNORMAL HIGH (ref 0.60–1.30)
EGFR (African American): 26 — ABNORMAL LOW
GFR CALC NON AF AMER: 23 — AB
Glucose: 145 mg/dL — ABNORMAL HIGH (ref 65–99)
OSMOLALITY: 287 (ref 275–301)
Potassium: 4.1 mmol/L (ref 3.5–5.1)
Sodium: 138 mmol/L (ref 136–145)

## 2013-11-04 LAB — MAGNESIUM: MAGNESIUM: 1.3 mg/dL — AB

## 2013-11-24 ENCOUNTER — Ambulatory Visit: Payer: Self-pay | Admitting: Oncology

## 2013-12-25 DEATH — deceased

## 2014-07-04 IMAGING — NM NM  CARDIAC MUGA REST SCAN 2 0F 2
6 series · 38 of 38 positions shown · non-contrast
Comparison: none

REASON FOR EXAM: High risk chemo
COMMENTS:

[Series 1000: 45 lao-gated (original with roi) · 3.30mm/px · 6 of 24 frames shown]
[frame 3/24]
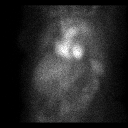
[frame 7/24]
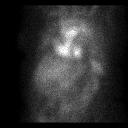
[frame 11/24]
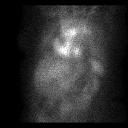
[frame 15/24]
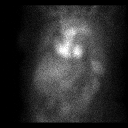
[frame 19/24]
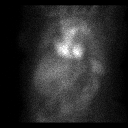
[frame 23/24]
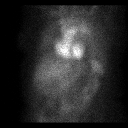

[Series 1000: 45 lao-gated (results) · 3.30mm/px · 6 of 24 frames shown]
[frame 3/24]
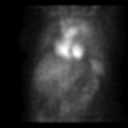
[frame 7/24]
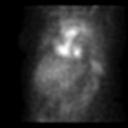
[frame 11/24]
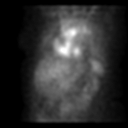
[frame 15/24]
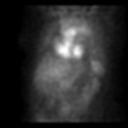
[frame 19/24]
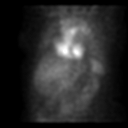
[frame 23/24]
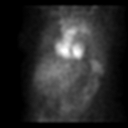

[Series 1000: 45 lao-gated · 3.30mm/px · 6 of 24 frames shown]
[frame 3/24]
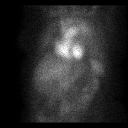
[frame 7/24]
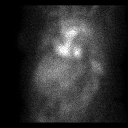
[frame 11/24]
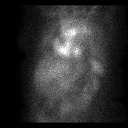
[frame 15/24]
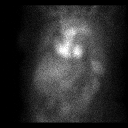
[frame 19/24]
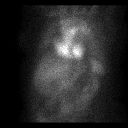
[frame 23/24]
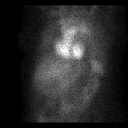

[Series 1000: 45 lao-gated (functional) · 3.30mm/px · 8 of 8 slices shown]
[im 1/8]
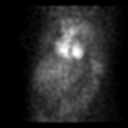
[im 2/8]
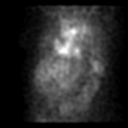
[im 3/8]
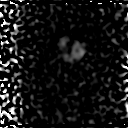
[im 4/8  full-range]
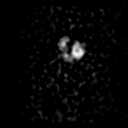
[im 5/8  full-range]
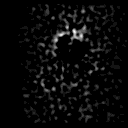
[im 6/8  full-range]
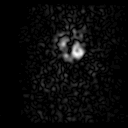
[im 7/8]
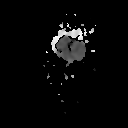
[im 8/8]
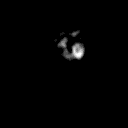

[Series 1000: anterior-gated · 3.30mm/px · 6 of 24 frames shown]
[frame 3/24]
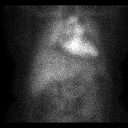
[frame 7/24]
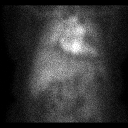
[frame 11/24]
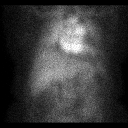
[frame 15/24]
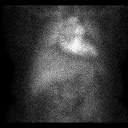
[frame 19/24]
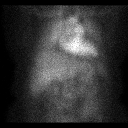
[frame 23/24]
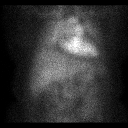

[Series 1000: 70 degree-gated · 3.30mm/px · 6 of 24 frames shown]
[frame 3/24]
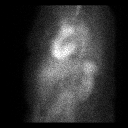
[frame 7/24]
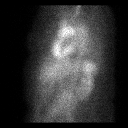
[frame 11/24]
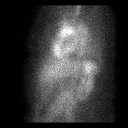
[frame 15/24]
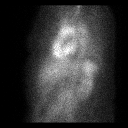
[frame 19/24]
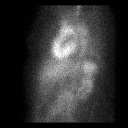
[frame 23/24]
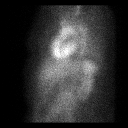

[38 of 38 positions shown; findings below may reference images not displayed]

PROCEDURE:     NM  - NM REST MUGA SCAN [DATE] OF [DATE]  - [DATE] [DATE] [DATE] [DATE]

RESULT:     The patient received an injection of 3.0 mL of PYP followed by
24.47 mCi of technetium 99m pertechnetate. Anterior, oblique and lateral
projections are obtained. There is no evidence of focal wall motion
abnormality. Contractility of the left ventricle appears normal. Left
ventricular ejection fraction is measured at 61.3% but visually actually
appears greater than that. The heart rate is measured at 60 beats per minute.
IMPRESSION: Normal left ventricular wall motion and ejection fraction.

[REDACTED]

## 2014-09-13 NOTE — Consult Note (Signed)
PATIENT NAME:  Janet Arnold, Janet Arnold MR#:  [id number] DATE OF BIRTH:  [birth year]  DATE OF CONSULTATION:  03/25/2012  REFERRING PHYSICIAN:  Dr.  Teresa Coombs CONSULTING PHYSICIAN:  Vivien Presto, MD  PRIMARY CARE PHYSICIAN: Dr. Derrel Nip  ONCOLOGIST: Dr. Oliva Bustard     REASON FOR CONSULT: Hypertension, diabetes.   HISTORY OF PRESENT ILLNESS: The patient is a pleasant 69 year old Caucasian female with history of hypertension, diabetes, chronic back pain, peripheral vascular disease status post angioplasty and stent placement earlier this year with Dr. 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 She is in bed currently and her daughter and her husband are in the room. Medicine consult was requested for help with blood pressure and diabetes management. The patient states that her blood pressure usually is under control and her recent hemoglobin A1c is in the 6s. The patient is on metoprolol and amlodipine as an outpatient for hypertension and on oral diabetes medications.  Here her blood pressure has been borderline and on the lower side. She is n.p.o. and has an NG tube placed. She complains of some abdominal pain and some left leg pain. She states that she had banged her left tibial area against a table a couple of weeks ago, sustaining major bruising. The patient also has had a stent in the left lower extremity. The pain is between the ankle and the knee area. The patient denies having any fevers. The patient is not passing gas or stool. She has no dysuria.   PAST MEDICAL HISTORY:  1. Peripheral vascular disease, status post angioplasty and stent placement in the left lower extremity.  2. Hypertension.  3. Diabetes.  4. Tonsillectomy.  5. Cholecystectomy. 6. Partial hysterectomy. 7. Chronic back pain status post spinal stimulator placement.    ALLERGIES: Dilantin and codeine.   SOCIAL HISTORY:  Positive for tobacco abuse, still smokes. No alcohol or drug use as per her family.  FAMILY HISTORY: Breast cancer and lymphoma in the family. Father alcoholic and also history of heart disease.   OUTPATIENT MEDICATIONS:  1. Alprazolam 0.25 mg, 1 tab every six hours as needed for anxiety.  2. Amlodipine 10 mg daily.  3. Aspirin 81 mg daily.  4. Effexor XR 150 mg 2 times a day. 5. Glimepiride 2 mg daily.  6. Kombiglyze XR  2.09/998 mg extended release, 1 tab in the morning. 7. Metoprolol tartrate 100 mg 2 times a day.  8. Nitroglycerin sublingual p.r.n.  9. Zofran 4 mg p.r.n. for nausea.  10. Oxycodone 10 mg every six hours p.r.n. for pain.  11. Protonix 40 mg 2 times a day.   12. ProAir HFA 2 puffs every 4 to 6 hours, which she takes perhaps once or twice a month. 13. Red yeast rice 600 mg, one cap 2 times a day.   REVIEW OF SYSTEMS:  CONSTITUTIONAL: No fever. Positive for abdominal pain and left lower extremity pain. Positive for weight loss recently. EYES: No blurry vision or double vision.  ENT: No tinnitus or hearing loss. RESPIRATORY: No cough, wheezing, dyspnea, or asthma. No shortness of breath. CARDIOVASCULAR: No chest pain or orthopnea. No swelling in the legs. History of high blood pressure. GI: Positive for nausea. No vomiting. Positive for abdominal pain, generalized.  No dark stools.  GU: No dysuria or hematuria. HEME/LYMPH: No anemia. SKIN: Positive for bruising on the left lower extremity. NEUROLOGIC: No numbness or weakness or strokes. PSYCH:  Positive for anxiety and depression.   PHYSICAL EXAMINATION:  VITAL SIGNS: Temperature 97.4, pulse rate 108, respiratory rate 18, blood pressure 102/53, oxygen saturation 98% on 2 liters oxygen.   GENERAL: The patient is an obese Caucasian female laying in bed in mild distress.   HEENT: Normocephalic, atraumatic. Pupils are equal and reactive. Anicteric sclerae. Extraocular  muscles intact. Dry mucous membranes.   NECK: Supple. No thyroid tenderness.   CARDIOVASCULAR: S1, S2, tachycardic. No murmurs, rubs, or gallops.   LUNGS: Clear to auscultation in anterior fields. No wheezing or rhonchi.   ABDOMEN: Soft. Positive for generalized tenderness. There are bandages and JP drainage draining serosanguineous drainage moderately. Positive bowel sounds. However, no rebound or guarding.   EXTREMITIES: No significant lower extremity edema on the left or right lower extremity.  On the tibial area of the left leg there is some ecchymosis and bruising and some tenderness to palpation in the mid tibial region. Pulses intact. No other rashes on the skin.   NEUROLOGICAL: Cranial nerves II through XII are grossly intact. Strength is five out of five in all extremities.   PSYCH: Awake, alert, oriented times 3. Cooperative.  LABORATORY DATA: Glucose today, the last one is 186. BNP 249, BUN 13, creatinine 1.28, sodium 138, potassium 5.3, chloride 108. Magnesium 1.6. WBC 16.6. Yesterday it was 9.3. Hemoglobin 10.5, yesterday 12.4. Platelets today 358.   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  Medicine is consulted for help with management of hypertension and diabetes. The patient currently is n.p.o. Her blood pressure is on the relatively lower side and cannot tolerate blood pressure medications at this point. The patient also has hyperglycemia requiring sliding scale insulin, hypomagnesemia, and hyperkalemia. She still has some mild to moderate abdominal pain and she also complains of left lower extremity. In regards to the hypotension,  we agree with holding the  blood pressure medications. However, postoperatively we should try to resumed the beta blocker at least as soon as blood pressure allows. Last blood pressures are still borderline and cannot tolerate a beta blocker. However, we will put in an order for p.r.n. metoprolol IV if blood pressure allows.   In regards to her diabetes, she states her hemoglobin A1c is in the mid 6s. We are agreeable with  holding her oral agents and continuing sliding-scale insulin. The patient also does have some hyperkalemia with potassium of 5.3. She was also at some point on  IV fluids with potassium, which have since been discontinued. She is on one-half normal saline at this point. We will order two doses of albuterol p.r.n. and recheck basic metabolic panel in the morning. We will also replete the magnesium. She does have history of tobacco abuse and she was counseled for about three minutes. She does want to quit tobacco abuse at this point and we will start her on a nicotine patch.    In regards to her left lower extremity pain,  I doubt this is a deep vein thrombosis. She bumped her tibial area against a table. We will obtain an x-ray of that area to rule out a fracture. She states the pain was worse after the surgery. We will obtain an ultrasound of the left lower extremity to rule out deep vein thrombosis as well. We recommend  continuation of the Lovenox. Her pain is fairly controlled and she is on a PCA. I did advise her to continue using incentive spirometry and early mobilization. Thank you for the kind consult and we will follow along with you.    ____________________________ Vivien Presto, MD sa:bjt D: 03/25/2012 14:29:23 ET T: 03/25/2012 15:05:49 ET JOB#: [id number]  cc: Vivien Presto, MD, <Dictator> Deborra Medina, MD Vivien Presto MD ELECTRONICALLY SIGNED 03/27/2012 12:46

## 2014-09-13 NOTE — Op Note (Signed)
PATIENT NAME:  [name], Janet Arnold MR#:  [id number] DATE OF BIRTH:  05-Jan-1946  DATE OF PROCEDURE:  04/20/2012  PREOPERATIVE DIAGNOSIS:  Ovarian cancer with limited venous access.   POSTOPERATIVE DIAGNOSIS:  Ovarian cancer with limited venous access.   PROCEDURE: 1. Ultrasound guidance for vascular access, right jugular vein.  2. Fluoroscopic guidance for placement of catheter.  3. Placement of CT-compatible Port-A-Cath, right jugular vein.   SURGEON: Algernon Huxley, M.D.   ANESTHESIA: Local with moderate conscious sedation.   ESTIMATED BLOOD LOSS: Minimal.   FLUOROSCOPY TIME: Less than one minute.   CONTRAST USED: None.   INDICATION FOR PROCEDURE: This is a 70 year old white female with ovarian cancer. She begins chemotherapy treatments tomorrow and needs a Port-A-Cath for venous access.   DESCRIPTION OF PROCEDURE: The patient is brought to the vascular and interventional radiology suite. Right neck and chest were sterilely prepped and draped and a sterile surgical field was created. The right jugular vein was visualized with ultrasound and found to be widely patent. It was then accessed under direct ultrasound guidance without difficulty with a Seldinger needle. J-wire was placed. After skin nick and dilatation, the peel-away sheath was placed over the wire. I then anesthetized an area two fingerbreadths below the right clavicle and created an inferior pocket, and then tunneled from the subclavicular incision to the access site. The port was secured to the chest wall with 2 Prolene sutures. The catheter was connected to the port and then tunneled from the subclavicular incision to the access site. Fluoroscopic guidance was then used to cut the catheter to an appropriate length. It was then placed through the peel-away sheath and the peel-away sheath was removed. The catheter tip was parked in excellent location just into the right atrium. It withdrew blood well and flushed easily with heparinized  saline. The wound was then irrigated with an antibiotic-impregnated solution and closed with a running 3-0 Vicryl and 4-0 Monocryl. The access incision was closed with a single 4-0 Monocryl. Dermabond was placed as a dressing. The patient tolerated the procedure well was taken to the recovery room in stable condition.    ____________________________ Algernon Huxley, MD [id number]:bjt D: 04/20/2012 11:44:03 ET T: 04/20/2012 12:35:40 ET JOB#: [id number]  cc: Algernon Huxley, MD, <Dictator> Martie Lee. Choksi, MD R. Barnett Applebaum, MD Deborra Medina, MD Algernon Huxley MD ELECTRONICALLY SIGNED 04/22/2012 9:44

## 2014-09-13 NOTE — Op Note (Signed)
PATIENT NAME:  Janet Arnold, Janet Arnold MR#:  [id number] DATE OF BIRTH:  Jun 15, 1945  DATE OF PROCEDURE:  03/24/2012  PREOPERATIVE DIAGNOSIS: Peritoneal carcinomatosis.   POSTOPERATIVE DIAGNOSIS: Advanced peritoneal carcinomatosis.   PROCEDURES PERFORMED:  1. Exploratory laparotomy.  2. Bilateral salpingo-oophorectomy.  3. Tumor reductive surgery.  4. Resection of terminal ileum and ascending colon with reanastomosis.  5. Total omentectomy.   SURGEON: Weber Cooks, MD   ASSISTANT: Barnett Applebaum, MD   ANESTHESIA: General.   ESTIMATED BLOOD LOSS: 500 mL.   COMPLICATIONS: None.   INDICATIONS FOR SURGERY: Janet Arnold is a 69 year old patient who presented with abdominal discomfort and distention and on radiologic work-up was noted to have peritoneal carcinomatosis. Paracentesis was done and confirmed the presence of an adenocarcinoma. The colonoscopy was normal. CA-125 level was above 2000. Therefore, the decision was made to proceed with surgery.   FINDINGS AT TIME OF SURGERY: Small volume disease on the diaphragm, less than 5 mm. Stomach, liver and spleen with minimal implants. Large omental cake with dense adhesions to the transverse colon. Small volume disease on the jejunum and proximal ileum. Large volume disease around the terminal ileum, cecum and ascending colon. Small volume disease in the ovaries and moderate nodularity in the cul-de-sac.  Residual disease after the procedure was less than 1 cm.   OPERATIVE REPORT: After adequate general anesthesia had been obtained, the patient was prepped and draped in ski position. A midline incision was placed with a sharp knife and carried down through the fascia. The incision was extended cephalad and caudad. Exploration was performed with the above-mentioned findings. A Bookwalter retractor was placed.   Attention was first directed towards the pelvis. The pelvic sidewall was entered on the right side. Vessels and ureter were identified. The  infundibulopelvic ligament was clamped, cut, and ligated twice. The adnexa and the peritoneal tumor were then freed from the pelvic sidewall. The ureter was freed up all the way down. The same procedure was performed on the contralateral side. The incision was then extended anteriorly over the bladder encompassing all peritoneal implants. By blunt and sharp dissection, the peritoneum was peeled away from the bladder and from the rectosigmoid so that all large volume nodules could be removed with the adnexa. Irrigation of the pelvis was obtained and adequate hemostasis confirmed. More tumor implants on the sigmoid mesentery were resected. The serosal injury to the rectosigmoid was closed with interrupted 3-0 silk sutures. Evicel was used to improve hemostasis, which was adequate at the end of this procedure.   Attention was then directed towards the abdomen. The peritoneum on the right gutter was incised, and it was possible to mobilize the cecum and terminal ileum containing a large volume tumor involving the mesentery.   First our attention was directed towards the omentectomy. Using the Harmonic scalpel as well as Metzenbaum scissors, the omentum was carefully freed from the transverse colon and the stomach. Dense adhesion between tumor and colon were noted, so dissection progressed slowly. Finally, though, the entire omentum could be mobilized and was removed using the Harmonic scalpel. The lesser sac was explored and found to be without tumor. Minimal nodularity was felt around the lesser curvature of the stomach. Serosal injuries of the transverse colon were repaired using 3-0 silk interrupted sutures.   Attention was then directed to the ascending colon. Mobilization of ascending colon and terminal ileum was completed. The ureter was kept under constant visualization. Then the terminal ileum was transected using the GIA in an area without  tumor. The colon was transected at the hepatic flexure. The  mesentery was incised and transected using the Harmonic scalpel. Two larger vessels were stitch ligated with 2-0 Vicryl. Hemostasis was adequate. Then the small bowel was attached to the colon using the GIA, and the incision was closed with another GIA staple line. Hemostasis was noted to be adequate. Clinical exam failed to reveal any leakage. The suture line was reinforced with appendices epiploicae using 3-0 silk.   Inspection was done and adequate hemostasis was noted in all areas. Surgiflo was placed around the area of omental mobilization. The position of the NG tube was confirmed to be adequate.  Laps, sponges and retractors were removed. The fascia was closed with a running #1 loop PDS suture. Adequate hemostasis in the subcutaneous tissue was confirmed before it was reapproximated with 2-0 Vicryl. The skin was closed with staples.   The patient tolerated the procedure well and was taken to the recovery room in stable condition. Postoperative urine was clear. Laps, sponge, needle, and instrument counts were correct x2.  ____________________________ Weber Cooks, MD bem:cbb D: 03/24/2012 17:02:14 ET T: 03/24/2012 17:38:54 ET JOB#: [id number]  cc: Weber Cooks, MD, <Dictator> Weber Cooks MD ELECTRONICALLY SIGNED 03/31/2012 17:37

## 2014-09-13 NOTE — Consult Note (Signed)
Brief Consult Note: Diagnosis: C diff colitis.   Patient was seen by consultant.   Comments: Patient with abdominal distension and h/o recurrent C. diff colitis. On Vancomycin for few days and was treayed with Flagyl earlier this month. Abdominal distension could be due to persistant C. diff colitis although partial SBO is another consideration. Ileus is possible but less likely as bowel sounds are hyperactive and high pitched. Mesenteric ischemia is another possibility.  Ovarian cancer with peritoneal carcinomatosis.  Recommendations: Conservative treatment with NG suction and bowel rest. Obtain stool for C. diff. Continue Vancomycin. Serum lactic acid. Surgical consultation. Will follow.  Electronic Signatures: Jill Side (MD)  (Signed 30-Dec-13 17:41)  Authored: Brief Consult Note   Last Updated: 30-Dec-13 17:41 by Jill Side (MD)

## 2014-09-13 NOTE — Discharge Summary (Signed)
PATIENT NAME:  Janet Arnold, HOCHSTEIN MR#:  [id number] DATE OF BIRTH:  09/30/1945  DATE OF ADMISSION:  03/24/2012 DATE OF DISCHARGE:  03/31/2012   REASON FOR ADMISSION: The patient is a 69 year old gravida 3 para 32 white female who presents with right adnexal mass that was found incidentally on CT ultrasound and also having symptoms of bloating, cramping, fullness, nausea, diarrhea, sweating, insomnia, and weight loss. It was determined preoperatively that this was most likely ovarian cancer and the patient was prepared for surgery for removal of tumor and tumor reduction. The patient now presents in the care of Dr. Kenton Kingfisher and Dr. Jacquelyne Balint for surgery and postoperative care.   COURSE IN HOSPITAL: The patient was taken to the operating room where she underwent extensive laparotomy surgery for tumor reduction which included removing her ovaries, omentum, part of her ileum and colon, and necessary tissues. The patient went to the recovery room in stable condition and over the next seven days was gradually improved in her care. She initially had NG tube, PCA, Foley catheter, JP drain, and IV fluids in place. These were gradually advanced or removed as appropriate. The patient had reinstitution of her diabetic medicines, beta-blocker medicine, and other medicines at the appropriate intervals. The patient was advanced to a regular diet and is ambulating some in her room and hallway. The patient has staples of her incision that are removed on the day of discharge with Steri-Strips applied. The patient has follow-up arranged with the [facility] with plans for chemotherapy in the near future. The patient has recovered well with a good prognosis for recovery from the surgery and will continue follow-up as arranged. Instructions are provided to the patient for continued postop recovery room at home.   ____________________________ R. Barnett Applebaum, MD rph:drc D: 03/31/2012 07:41:01 ET T: 03/31/2012 11:22:10  ET JOB#: [id number]  cc: Glean Salen, MD, <Dictator> Gae Dry MD ELECTRONICALLY SIGNED 04/01/2012 14:11

## 2014-09-13 NOTE — Consult Note (Signed)
Chief Complaint:   Subjective/Chief Complaint Feels better. NG out. No vomiting.   VITAL SIGNS/ANCILLARY NOTES: **Vital Signs.:   31-Dec-13 09:30   Vital Signs Type Routine   Temperature Temperature (F) 98   Celsius 36.6   Temperature Source oral   Pulse Pulse 93   Respirations Respirations 20   Systolic BP Systolic BP 007   Diastolic BP (mmHg) Diastolic BP (mmHg) 80   Mean BP 96   BP Source  if not from Vital Sign Device non-invasive   Pulse Ox % Pulse Ox % 92   Pulse Ox Activity Level  At rest   Oxygen Delivery Room Air/ 21 %   Brief Assessment:   Additional Physical Exam Abdomen is less ditendede and soft.   Assessment/Plan:  Assessment/Plan:   Assessment Probable ileus. No evidence of high grade mechanical SBO or mesenteric ischemia.    Plan Gradually advance diet.   Electronic Signatures: Jill Side (MD)  (Signed 31-Dec-13 19:19)  Authored: Chief Complaint, VITAL SIGNS/ANCILLARY NOTES, Brief Assessment, Assessment/Plan   Last Updated: 31-Dec-13 19:19 by Jill Side (MD)

## 2014-09-13 NOTE — Consult Note (Signed)
Brief Consult Note: Diagnosis: htn,dm, hypperkalemia.   Patient was seen by consultant.   Consult note dictated.   Recommend further assessment or treatment.   Orders entered.   Discussed with Attending MD.   Comments: 69 yo with 2/2 major abd surg yesterday with presumed dx of peritoneal carcinamatosis with htn, dm, pvd, chronic back pain and medicine consulted for htn, dm -htn:  is npo. bp on lower side.  agree with holding bp meds, should resume metoprolol as soon as bp can tolerate.   -dm: agree with ssi. hold oral agents.  aic as op in 6s per daughter -hyperkalemia: albuerol nebsx2. 5.3. repeat in am.   -hypomagnesemia: will replete -left leg pain: has some tenderness. had some trauma 2 weeks ago. no sig swelling compared to right. xry/us -tobacco abuse: counsaled for 3 min. patch.  Electronic Signatures: Vivien Presto (MD)  (Signed 30-Oct-13 14:16)  Authored: Brief Consult Note   Last Updated: 30-Oct-13 14:16 by Vivien Presto (MD)

## 2014-09-16 NOTE — Op Note (Signed)
PATIENT NAME:  Janet Arnold, Janet Arnold MR#:  [id number] DATE OF BIRTH:  [birth year]  DATE OF OPERATION:  10/29/2012  PREOPERATIVE DIAGNOSES: 1.  Peripheral arterial disease (PAD) with claudication, left lower extremity, status post previous left iliac interventions.  2.  Diabetes. 3.  Ovarian cancer.  4.  Hypertension.  5.  Coronary disease.   POSTOPERATIVE DIAGNOSES: 1.  Peripheral arterial disease (PAD) with claudication, left lower extremity, status post previous left iliac interventions.  2.  Diabetes. 3.  Ovarian cancer.  4.  Hypertension.  5.  Coronary disease.   PROCEDURES:  1.  Ultrasound guidance for vascular access, right common femoral artery.  2.  Catheter placement in the left superficial femoral artery from right femoral approach.  3.  Aortogram, selective left lower extremity angiogram.  4. Percutaneous transluminal angioplasty of left external iliac artery, common femoral artery with 7 mm diameter angioplasty balloon.  5.  Percutaneous transluminal angioplasty of left external iliac artery and common iliac artery with an 8 mm diameter angioplasty balloon.  6.  Viabahn stent placement in the left common and external iliac arteries with 8 mm diameter Viabahn stent.   SURGEON: Chealsey Miyamoto.   ANESTHESIA: Local, with moderate conscious sedation.   BLOOD LOSS: Approximately 25 mL.   FLUOROSCOPY TIME: 7.8 minutes.   CONTRAST USED: 60 mL.   INDICATION FOR PROCEDURE: A 69 year old white female with the above-mentioned issues. She has had previous left iliac intervention. She has recurrent short-distance claudication symptoms of the lower extremity, and a noninvasive study showing a left iliac occlusion with a significant drop in her ABI. She is brought in for angiography with potential treatment given the lifestyle-limiting nature of her left leg pain and short-distance claudication. Risks and benefits were discussed. Informed consent was obtained.   DESCRIPTION OF PROCEDURE: The patient was  brought to the vascular and interventional radiology suite. Groins were shaved and prepped and a sterile surgical field was created. The patient's right femoral artery was visualized with ultrasound and found to be patent. It was then accessed under direct ultrasound guidance without difficulty with a Seldinger needle. A J wire and 5-French sheath were placed. Pigtail catheter was placed into the aorta at the L1-L2 level, and AP aortogram was performed. This showed patent renal arteries bilaterally. The aorta itself was calcified, but not stenotic. The right iliac artery had no greater than 40% stenosis at the iliac bifurcation to the internal external iliac arteries. The left iliac artery was occluded a couple of centimeters into the previously placed left common iliac artery stent. The stent went down to the left external iliac artery. The occlusion continued distally below this to the level of the common femoral artery. She then had a patent superficial femoral artery, profunda femoris artery, and there appeared to be 2-vessel runoff distally, without flow-limiting stenosis below the inguinal canal.   Initially a 6-layer, 7-French Ansell sheath had been placed over a Terumo Advantage wire. It was able to cross the occluded lesion without difficulty with the help of a glide catheter and confirmed intraluminal flow, parking the glide catheter in the superficial femoral artery and this is where we got better pictures for distal runoff.   Diagnostic catheter was then removed. A 7 mm diameter angioplasty balloon was taken down to the left femoral head, inflated back up to the common iliac artery. Waists were taken, which resolved. An 8 mm diameter angioplasty balloon was then used in the external iliac artery and common iliac artery, both below the previously-placed stents  and within with the stent. Following this, there were still 2 areas of greater than 50% of residual stenosis within previous left iliac stents,  and I elected to treat these areas with a covered stent due to the recurrent, and then residual, stenosis after angioplasty. I exchanged for an 0.018 wire and an 8 mm diameter  x 10 cm in length Viabahn stent was taken from approximately 2 cm beyond the aortic bifurcation within the previously placed iliac stent encompassing both lesions within the iliac stent down into the left external iliac artery. This was ironed out with an 8 mm balloon with an excellent angiographic completion result. Oblique arteriogram was then performed of the right femoral artery, and a StarClose closure device was deployed in the usual fashion, with excellent hemostatic result. The patient tolerated the procedure well and was taken to the recovery room in stable condition.    ____________________________ Algernon Huxley, MD jsd:dm D: 10/29/2012 09:53:47 ET T: 10/29/2012 10:13:34 ET JOB#: [id number]  cc: Algernon Huxley, MD, <Dictator> Algernon Huxley MD ELECTRONICALLY SIGNED 11/04/2012 10:58

## 2014-09-17 NOTE — Consult Note (Signed)
PATIENT NAME:  Janet Arnold, Janet Arnold MR#:  [id number] DATE OF BIRTH:  1945/12/30  DATE OF CONSULTATION:  05/09/2014  CONSULTING PHYSICIAN:  Manya Silvas, MD  HISTORY OF PRESENT ILLNESS: The patient is a 69 year old white female who has been operated on for ovarian cancer, was found to be metastatic with implants on the peritoneum and in the colon. She had a couple of feet of her colon taken out. She was on Coumadin and got some antibiotics, and the interaction caused her blood to become very thin, and she began having bleeding a few days ago. Bleeding increased to 8 or 10 times a day, and she was admitted to the hospital yesterday with an INR very elevated. She was begun on fresh frozen plasma and vitamin K, with significant improvement in her condition. I was asked to see her in consultation for the GI bleeding.   Her INR went from 12.4 to 2.6 today. Her hemoglobin went from 8.1 to 6, up to 9.2 today. BUN went from 29 down to 15. She is feeling much better. Her last passage of any old blood-looking material was 5 hours ago.   REVIEW OF SYSTEMS: She denies any vomiting. No fever. No history of heart disease. No previous heart attack. No previous stroke. She does have a history of diabetes, and she takes medication for that.   HABITS: Smokes 1-1/2 packs a day. Does not drink alcohol. Smoked for the last 35 years.   PAST MEDICAL HISTORY: Also includes hysterectomy.   She had a colonoscopy and an upper endoscopy by Dr. Allen Norris in August 2014, and the upper was normal. The colonoscopy showed some diverticular disease.   PHYSICAL EXAMINATION:  VITAL SIGNS: Blood pressure 122/34, pulse 68, respirations 16. She is 96% on room air. GENERAL: An elderly lady interviewed and examined in front of family.  HEENT: Sclerae nonicteric. Conjunctivae negative. Tongue is negative. The head is atraumatic.  NECK: Showed no carotid bruits.  CHEST: Clear anterior fields.  HEART: Shows no murmurs or gallops I can hear.   ABDOMEN: Showed old scars present. No distention, no hepatosplenomegaly, no masses and no bruits.  SKIN: Warm and dry.  PSYCHIATRIC: Mood and affect are appropriate   LABORATORY DATA: Hemoglobin 9.2, INR 2.6.   ASSESSMENT: The patient with bleeding while her blood was heavily anticoagulated, with significant improvement now, with a rise in her hemoglobin and her INR is down to 2.6. When the INR is very elevated, even trivial mucosal irritations can bleed easily. It is possible she could have bled from the anastomosis where she had previous portion of her colon removed. It is possible she could have bled from diverticular disease, and it is possible she could have bled from otherwise insignificant problems in her upper GI tract. Consideration could be made for doing a capsule study. This does not need to be done immediately. It can be done as an outpatient. If her clinical status improves with the improvement in her INR, it may not be necessary to do any endoscopic procedures, but follow her up as an outpatient and do a capsule study. Because of her previous neoplasm and surgery, it might be best to do a dummy capsule first. I will follow with you, and depending on her clinical course, may or may not do an upper endoscopy, but she should have a capsule study after discharge.    ____________________________ Manya Silvas, MD rte:lb D: 07/10/2013 10:59:00 ET T: 07/10/2013 11:30:01 ET JOB#: [id number]  cc: Manya Silvas,  MD, <Dictator> Simonne Come. Inez Pilgrim, MD Manya Silvas MD ELECTRONICALLY SIGNED 08/05/2013 11:12

## 2014-09-17 NOTE — Consult Note (Signed)
Pt doing better, hgb 9.1, INR 2.4, no bleeding, denies nausea, vomiting, abd pain. VSS afebrile, 93% on room air. I think best approach would be to do a capsule study tomorrow with a dummy capsule and the real capsule tuesday. Keep INR in 2.5 to 3.5 range if possible.  Electronic Signatures: Manya Silvas (MD)  (Signed on 15-Feb-15 09:14)  Authored  Last Updated: 15-Feb-15 09:14 by Manya Silvas (MD)

## 2014-09-17 NOTE — Op Note (Signed)
PATIENT NAME:  Janet Arnold, Janet Arnold MR#:  [id number] DATE OF BIRTH:  1946/05/26  DATE OF PROCEDURE:  08/16/2013  PREOPERATIVE DIAGNOSES: 1. Atheroembolization of the right lower extremity.  2. Peripheral arterial disease with claudication.  3. Status post left lower extremity revascularization previously.  4. Coronary disease.  5. Diabetes.  6. Hypertension.  7. Gastrointestinal bleed within the past six months.   POSTOPERATIVE DIAGNOSES:  1. Atheroembolization of the right lower extremity.  2. Peripheral arterial disease with claudication.  3. Status post left lower extremity revascularization previously.  4. Coronary disease.  5. Diabetes.  6. Hypertension.  7. Gastrointestinal bleed within the past six months.   PROCEDURE: 1. Ultrasound guidance for vascular access left femoral artery.  2. Catheter placement to right common femoral artery from left femoral approach.  3. Aortogram and selective right lower extremity angiogram.  4. An 8 mm diameter x 6 cm length LifeStar stent to place to the right iliac artery and post dilated with an 8 mm balloon.  5.  StarClose closure device left femoral artery.   SURGEON: Leotis Pain, M.D.   ANESTHESIA: Local with moderate conscious sedation.   ESTIMATED BLOOD LOSS: 25 mL.   INDICATION FOR PROCEDURE: A 69 year old white female with a history of peripheral arterial disease with claudication. She developed right lower extremity embolization after being taken off of her blood thinners several weeks ago for GI bleeding. Her right foot showed significant atheroembolic disease with persistent pain. Noninvasive study showed a significant stenosis in the right iliac system. She is brought in for angiography for further evaluation and potential treatment. Risks and benefits were discussed. Informed consent was obtained.   DESCRIPTION OF PROCEDURE: The patient is brought to the vascular suite. Groins were shaved and prepped, and a sterile surgical field was  created.  Ultrasound is used to visualize a patent left femoral artery, this was accessed with direct ultrasound guidance without difficulty with Seldinger needle. A J-wire and 5 French sheath were placed. Pigtail catheter was placed near at the L1-L2 level and AP aortogram was performed was then pulled down the aortic bifurcation and pelvic obliques were performed. This demonstrated the aorta was patent with some mild irregularity distally. The renal arteries were widely patent. The left iliac stent was widely patent. The right hypogastric artery was occluded at what was likely the previous iliac bifurcation and there was about a 70% to 75% stenosis, which was irregular. Below this, there was normal common femoral, profunda femoris superficial femoral and popliteal arteries. There was normal tibial trifurcation. The anterior tibial artery was the dominant runoff to the foot, although the posterior tibial artery also appeared to be small, but provided some flow to the foot. At this point, the patient was heparinized. A 6 French Ansell sheath was placed over a Truman advantage wire after parking a catheter in the right common femoral artery beyond the lesion. An 8 mm diameter x 6 cm length LifeStar stent was selected to primarily stent the embolic lesion. This was postdilated with an 8 mm balloon with an excellent angiographic completion result and less than 20% residual stenosis. At this point, I elected to terminate the procedure. The sheath was pulled back to the ipsilateral external iliac artery and oblique arteriogram was performed. StarClose closure device with deployed in the usual fashion with excellent hemostatic result. The patient tolerated the procedure well and was taken to the recovery room in stable condition.    ____________________________ Algernon Huxley, MD jsd:sg D: 08/16/2013 33:82:50 ET  T: 08/16/2013 10:54:31 ET JOB#: [id number]  cc: Algernon Huxley, MD, <Dictator> Algernon Huxley  MD ELECTRONICALLY SIGNED 08/30/2013 9:47

## 2014-09-17 NOTE — Consult Note (Signed)
Brief Consult Note: Diagnosis: GI bleed.   Patient was seen by consultant.   Consult note dictated.   Recommend further assessment or treatment.   Discussed with Attending MD.   Comments: [facility] has corrected INR. Unclear if bleeding is ongoing but pt is HD stable will need GI to eval for further diagnostice testing (EGD etc).  Electronic Signatures: Florene Glen (MD)  (Signed 14-Feb-15 08:58)  Authored: Brief Consult Note   Last Updated: 14-Feb-15 08:58 by Florene Glen (MD)

## 2014-09-17 NOTE — Discharge Summary (Signed)
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 On maint. Avastin at present.  ?? HPI  Patient presented to clinic today as urgent add on.  she was seen yesterday for melana and referred to [facility] surgical where she saw the NP Kandace this moring.  Labs were drawn and PT/INR was found to be elevated at 85 and 11.7.  Patient was notified and asked to come to the clinic for admission.  She reports having 1 liquied BM yesterday ant this morning, very dark in color. Denies nausea/vomiting, some mild ongoing abdominal pain.  States over the last week she has had increasing dizziness, but denies falls.  Specifically denies fever, chills, HA, chest pain, dyspnea, constipation.  States she ate a hotdog after seeing surgery this AM, denies indigestion. She reports feeling well with no significant side effects except the melana and   Allergies:  Dilantin: Other, Resp. Distress  Codeine: N/V/Diarrhea  Hepatic:  14-Feb-15 06:12   Bilirubin, Total 0.3  Alkaline Phosphatase 87 (45-117 NOTE: New Reference Range 04/16/13)  SGPT (ALT) 23  SGOT (AST) 27  Total  Protein, Serum  6.3  Albumin, Serum  3.0  Routine BB:  14-Feb-15 14:51   Crossmatch Unit 1 Transfused  Result(s) reported on 11 Jul 2013 at 08:05AM.  Routine Chem:  14-Feb-15 00:08   Result Comment LABS - This specimen was collected through an   - indwelling catheter or arterial line.  - A minimum of 39ms of blood was wasted prior    - to collecting the sample.  Interpret  - results with caution.  Result(s) reported on 10 Jul 2013 at 12:48AM.    06:12   Potassium, Serum 3.8  Result Comment LABS - This specimen was collected through an   - indwelling catheter or arterial line.  - A minimum of 535m of blood was wasted prior    - to collecting the sample.  Interpret  - results with caution.  Result(s) reported on 10 Jul 2013 at 06:57AM.  Glucose, Serum  45  BUN 15  Creatinine (comp) 0.99  Sodium, Serum 141  Chloride, Serum  111  CO2, Serum 24  Calcium (Total), Serum  8.4  Anion Gap  6  Osmolality (calc) 279  eGFR (African American) >60  eGFR (Non-African American)  59 (eGFR values <6043min/1.73 m2 may be an indication of chronic kidney disease (CKD). Calculated eGFR is useful in patients with stable renal function. The eGFR calculation  will not be reliable in acutely ill patients when serum creatinine is changing rapidly. It is not useful in  patients on dialysis. The eGFR calculation may not be applicable to patients at the low and high extremes of body sizes, pregnant women, and vegetarians.)    11:11   Potassium, Serum 3.6  Result Comment LABS - This specimen was collected through an   - indwelling catheter or arterial line.  - A minimum of 60ms of blood was wasted prior    - to collecting the sample.  Interpret  - results with caution.  Result(s) reported on 10 Jul 2013 at 11:30AM.  Result Comment potassium - Slight hemolysis, interpret results with  - caution.  Result(s) reported on 10 Jul 2013 at 11:30AM.    16:02   Potassium, Serum 3.7 (Result(s) reported on  10 Jul 2013 at 04:27PM.)  Result Comment labs - This specimen was collected through an   - indwelling catheter or arterial line.  - A minimum of 538m of blood was wasted prior    - to collecting the sample.  Interpret  - results with caution.  Result(s) reported on 10 Jul 2013 at 04:31PM.    21:30   Result Comment LABS - This specimen was collected through an   - indwelling catheter or arterial line.  - A minimum of 44m13mof blood was wasted prior    - to collecting the sample.  Interpret  - results with caution.  Result(s) reported on 10 Jul 2013 at 09:47PM.  16-Feb-15 05:18   Potassium, Serum 3.8 (Result(s) reported on 12 Jul 2013 at 05:48AM.)  Magnesium, Serum  1.3 (1.8-2.4 THERAPEUTIC RANGE: 4-7 mg/dL TOXIC: > 10 mg/dL  -----------------------)  Routine Coag:  13-Feb-15 22:26   Prothrombin  34.0  INR 3.5 (INR reference interval applies to patients on anticoagulant therapy. A single INR therapeutic range for coumarins is not optimal for all indications; however, the suggested range for most indications is 2.0 - 3.0. Exceptions to the INR Reference Range may include: Prosthetic heart valves, acute myocardial infarction, prevention of myocardial infarction, and combinations of aspirin and anticoagulant. The need for a higher or lower target INR must be assessed individually. Reference: The Pharmacology and Management of the Vitamin K  antagonists: the seventh ACCP Conference on Antithrombotic and Thrombolytic Therapy. CheYPPJK.9326pt:126 (3suppl): [id number] HCT value >55% may artifactually increase the PT.  In one study,  the increase was an average of 25%. Reference:  "Effect on Routine and Special Coagulation Testing Values of Citrate Anticoagulant Adjustment in Patients with High HCT Values." American Journal of Clinical Pathology 2006;126:400-405.)  14-Feb-15 00:08   Prothrombin  25.6  INR 2.4 (INR reference interval applies to patients on anticoagulant therapy. A  single INR therapeutic range for coumarins is not optimal for all indications; however, the suggested range for most indications is 2.0 - 3.0. Exceptions to the INR Reference Range may include: Prosthetic heart valves, acute myocardial infarction, prevention of myocardial infarction, and combinations of aspirin and anticoagulant. The need for a higher or lower target INR must be assessed individually. Reference: The Pharmacology and Management of the Vitamin K  antagonists: the seventh ACCP Conference on Antithrombotic and Thrombolytic Therapy. CheZTIWP.8099pt:126 (3suppl): [id number] HCT value >55% may artifactually increase the PT.  In one study,  the increase was an average of 25%. Reference:  "Effect on Routine and Special Coagulation Testing Values of Citrate Anticoagulant Adjustment in Patients with High HCT Values." American Journal of Clinical Pathology  9528;413:244-010.)    06:12   Prothrombin  27.1  INR 2.6 (INR reference interval applies to patients on anticoagulant therapy. A single INR therapeutic range for coumarins is not optimal for all indications; however, the suggested range for most indications is 2.0 - 3.0. Exceptions to the INR Reference Range may include: Prosthetic heart valves, acute myocardial infarction, prevention of myocardial infarction, and combinations of aspirin and anticoagulant. The need for a higher or lower target INR must be assessed individually. Reference: The Pharmacology and Management of the Vitamin K  antagonists: the seventh ACCP Conference on Antithrombotic and Thrombolytic Therapy. [id number] Sept:126 (3suppl): [id number]. A HCT value >55% may artifactually increase the PT.  In one study,  the increase was an average of 25%. Reference:  "Effect on Routine and Special Coagulation Testing Values of Citrate Anticoagulant Adjustment in Patients with High HCT Values." American Journal of Clinical Pathology 0347;425:956-387.)    11:11    Prothrombin  27.8  INR 2.7 (INR reference interval applies to patients on anticoagulant therapy. A single INR therapeutic range for coumarins is not optimal for all indications; however, the suggested range for most indications is 2.0 - 3.0. Exceptions to the INR Reference Range may include: Prosthetic heart valves, acute myocardial infarction, prevention of myocardial infarction, and combinations of aspirin and anticoagulant. The need for a higher or lower target INR must be assessed individually. Reference: The Pharmacology and Management of the Vitamin K  antagonists: the seventh ACCP Conference on Antithrombotic and Thrombolytic Therapy. FIEPP.2951 Sept:126 (3suppl): [id number]. A HCT value >55% may artifactually increase the PT.  In one study,  the increase was an average of 25%. Reference:  "Effect on Routine and Special Coagulation Testing Values of Citrate Anticoagulant Adjustment in Patients with High HCT Values." American Journal of Clinical Pathology 2006;126:400-405.)    16:02   Prothrombin  27.5  INR 2.6 (INR reference interval applies to patients on anticoagulant therapy. A single INR therapeutic range for coumarins is not optimal for all indications; however, the suggested range for most indications is 2.0 - 3.0. Exceptions to the INR Reference Range may include: Prosthetic heart valves, acute myocardial infarction, prevention of myocardial infarction, and combinations of aspirin and anticoagulant. The need for a higher or lower target INR must be assessed individually. Reference: The Pharmacology and Management of the Vitamin K  antagonists: the seventh ACCP Conference on Antithrombotic and Thrombolytic Therapy. [id number].6063 Sept:126 (3suppl): [id number]. A HCT value >55% may artifactually increase the PT.  In one study,  the increase was an average of 25%. Reference:  "Effect on Routine and Special Coagulation Testing Values of Citrate Anticoagulant Adjustment in  Patients with High HCT Values." American Journal of Clinical Pathology 2006;126:400-405.)    21:30   Prothrombin  26.8  INR 2.6 (INR reference interval applies to patients on anticoagulant therapy. A single INR therapeutic range for coumarins is not optimal for all indications; however, the suggested range for most indications is 2.0 - 3.0. Exceptions to the INR Reference Range may include: Prosthetic heart valves, acute myocardial infarction, prevention of myocardial infarction, and combinations of aspirin and anticoagulant. The need for a higher or lower target INR must be assessed individually. Reference: The Pharmacology and Management of the Vitamin K  antagonists: the seventh ACCP Conference on Antithrombotic and Thrombolytic Therapy. KZSWF.0932 Sept:126 (3suppl): [id number]. A HCT value >55% may artifactually increase the PT.  In one study,  the increase was an average of 25%. Reference:  "Effect on Routine and Special Coagulation Testing Values of  Citrate Anticoagulant Adjustment in Patients with High HCT Values." American Journal of Clinical Pathology 2006;126:400-405.)  16-Feb-15 05:18   Prothrombin  24.7  INR 2.3 (INR reference interval applies to patients on anticoagulant therapy. A single INR therapeutic range for coumarins is not optimal for all indications; however, the suggested range for most indications is 2.0 - 3.0. Exceptions to the INR Reference Range may include: Prosthetic heart valves, acute myocardial infarction, prevention of myocardial infarction, and combinations of aspirin and anticoagulant. The need for a higher or lower target INR must be assessed individually. Reference: The Pharmacology and Management of the Vitamin K  antagonists: the seventh ACCP Conference on Antithrombotic and Thrombolytic Therapy. [id number].7096 Sept:126 (3suppl): [id number]. A HCT value >55% may artifactually increase the PT.  In one study,  the increase was an average of  25%. Reference:  "Effect on Routine and Special Coagulation Testing Values of Citrate Anticoagulant Adjustment in Patients with High HCT Values." American Journal of Clinical Pathology 2006;126:400-405.)  Routine Hem:  13-Feb-15 22:26   Hemoglobin (CBC)  6.0 (Result(s) reported on 09 Jul 2013 at 10:44PM.)  14-Feb-15 06:12   Hemoglobin (CBC)  9.2  WBC (CBC) 7.1  RBC (CBC)  3.30  Hematocrit (CBC)  28.0  Platelet Count (CBC) 202  MCV 85  MCH 28.0  MCHC 32.9  RDW  19.9  Neutrophil % 67.0  Lymphocyte % 15.5  Monocyte % 15.6  Eosinophil % 0.7  Basophil % 1.2  Neutrophil # 4.8  Lymphocyte # 1.1  Monocyte #  1.1  Eosinophil # 0.0  Basophil # 0.1 (Result(s) reported on 10 Jul 2013 at 07:20AM.)    11:11   Hemoglobin (CBC)  9.4    16:02   Hemoglobin (CBC)  9.2    21:30   Hemoglobin (CBC)  9.2  16-Feb-15 05:18   Hemoglobin (CBC)  9.3 (Result(s) reported on 12 Jul 2013 at 05:53AM.)   PERTINENT RADIOLOGY STUDIES: XRay:    09-Mar-15 11:16, Small Bowel  Small Bowel   REASON FOR EXAM:    GI bleed anemia  COMMENTS:       PROCEDURE: FL  - FL SMALL BOWEL  - Aug 02 2013 11:16AM     CLINICAL DATA:  The anticipated procedure was discussed with Ms. 0.  She More STIR willingness to proceed. The patient has history of  previous partial bowel resection due to ovarian malignancy. There is  a nerve stimulator generator present.    EXAM:  SMALL BOWEL SERIES    TECHNIQUE:  Following ingestion of thin barium, serial small bowel images were  obtained including spot views of the terminal ileum.    COMPARISON:  CT scan of the abdomen dated June 21, 2013    FLUOROSCOPY TIME:  42 seconds    FINDINGS:  Over the course of approximately 90 min barium traversed the  normal-appearing small-bowel loops and reached the right colon. The  number of small bowel loops present is decreased consistent with a  previous partial resection. There is no evidence of a stricture at  the junction of the  small bowel with the right colon. No  inflammatory change is demonstrated.     IMPRESSION:  There is no evidence of obstruction nor other acute abnormality of  the right and remaining small bowel loops. Evaluation of the  enterocolic anastomosis is limited due to overlying nerve stimulator  generator density as well as the patient's inability to assume the  positioning that would be most advantageous.  Electronically Signed    By: David  Martinique    On: 08/02/2013 11:35         Verified By: DAVID A. Martinique, M.D., MD  LabUnknown:  PACS Image    Pertinent Past History:  Pertinent Past History ?? Comments Patient smokes one pack per day for last 30 years. ?? Additional Past Medical and Surgical History Peripheral vascular disease with angioplasty and stent placement.   Diabetes type II  Hypertension   Hospital Course:  Hospital Course during hospital stay patient was started onFresh  frozen plasma.  GI consultation was obtained.  Patient's bleeding gradually stopped.  Patient was taken off Coumadin.  Blood pressure anemia   got stabilised.  Study to Find Any Small Intestinal Bleeding Was planned As Outpatient.   Condition on Discharge Stable   DISCHARGE INSTRUCTIONS HOME MEDS:  Medication Reconciliation: Patient's Home Medications at Discharge:     Medication Instructions  questran light packets 4 g/5 g oral powder for reconstitution  1 PKT(S) orally 2 times a day   ambien 5 mg oral tablet  1 tab(s) orally once a day (at bedtime)   metoprolol 100 mg oral tablet  1 tab(s) orally 2 times a day   amlodipine 10 mg oral tablet  1 tab(s) orally once a day   glimepiride 2 mg oral tablet   orally 2 times a day   lexapro 10 mg oral tablet  1 tab(s) orally once a day   ondansetron 4 mg oral tablet  2 tab(s) orally every 4 hours, As Needed - for Nausea, Vomiting   pantoprazole 40 mg oral delayed release tablet  1 tab(s) orally 2 times a day   kombiglyze xr 1000 mg-2.5 mg oral  tablet, extended release  1 tab(s) orally once a day (in the evening)   diphen/atropine  1 tab(s) orally every 4 hours, As Needed - for Diarrhea -6 hours   nystatin topical 100000 units/g topical powder  Apply topically to affected area 3 times a day   diflucan 100 mg oral tablet  1 tab(s) orally once a day   duragesic-100 100 mcg/hr transdermal film, extended release  1 patch transdermal every 72 hours   oxycodone 10 mg oral tablet  2 tab(s) orally every 4 hours, As Needed - for Pain   duragesic-25 25 mcg/hr transdermal film, extended release  1 patch transdermal every 72 hours   alprazolam 0.25 mg oral tablet  1 tab(s) orally 2 times a day, As Needed   pramipexole 0.25 mg oral tablet  1 tab(s) orally once a day (at bedtime)   ferrous fumarate 325 mg (106 mg elemental iron) oral tablet  1 tab(s) orally once a day    PRESCRIPTIONS: ELECTRONICALLY SUBMITTED  STOP TAKING THE FOLLOWING MEDICATION(S):   coumadin  Physician's Instructions:  Home Health? No   Treatments None   Home Oxygen? No   Diet Regular   Dietary Supplements None   Diet Consistency Regular Consistency   Activity Limitations None   Return to Work Not Applicable   Time frame for Follow Up Appointment 1-2 weeks   Other Comments appointment with Dr. Vira Agar   Electronic Signatures: Oliva Bustard, Martie Lee (MD)  (Signed 17-Mar-15 15:08)  Authored: ADMISSION DATE AND DIAGNOSIS, CHIEF COMPLAINT/HPI, Allergies, PERTINENT LABS, PERTINENT RADIOLOGY STUDIES, [facility] [facility], PATIENT INSTRUCTIONS   Last Updated: 17-Mar-15 15:08 by Jobe Gibbon (MD)

## 2014-09-17 NOTE — Consult Note (Signed)
Consult done.  Pt with GI bleed while on coumadin.  Hemoglobin and INR have both improved greatly.  Will observe for now.  May not need repeat endoscopic studies but may benefit from capsule study of small bowel.  A "dummy" capsule should be used first to be sure no kinks in bowel since she had peritoneal implants of tumor at the time of surgery.  Will follow with you.  Electronic Signatures: Manya Silvas (MD)  (Signed on 14-Feb-15 11:01)  Authored  Last Updated: 14-Feb-15 11:01 by Manya Silvas (MD)

## 2014-09-17 NOTE — Consult Note (Signed)
Neither our GI dept at [facility] nor the GI dept at the hospital has any dummy capsules to test the small bowel before swallowing the real one.  Due to hx of possible peritoneal implants I think it would be a good idea to wait on dummy capsules which have been ordered today.  She could go home if you are comforrtable with her curent hemoglobin and protime.  Electronic Signatures: Manya Silvas (MD)  (Signed on 16-Feb-15 10:12)  Authored  Last Updated: 16-Feb-15 10:12 by Manya Silvas (MD)

## 2014-09-18 NOTE — Op Note (Signed)
PATIENT NAME:  Janet Arnold, Janet Arnold MR#:  [id number] DATE OF BIRTH:  10-26-1945  DATE OF PROCEDURE:  09/16/2011  PREOPERATIVE DIAGNOSES:  1. Peripheral arterial disease with claudication bilateral lower extremities, left worse than right.  2. Hypertension.  3. Coronary disease.  4. Diabetes.   POSTOPERATIVE DIAGNOSES:  1. Peripheral arterial disease with claudication bilateral lower extremities, left worse than right.  2. Hypertension.  3. Coronary disease.  4. Diabetes.   PROCEDURE PERFORMED:   1. Ultrasound guidance for vascular access to bilateral femoral arteries.  2. Catheter placement into aorta from bilateral femoral approaches.  3. Aortogram and iliofemoral arteriogram.  4. Percutaneous transluminal angioplasty of distal aorta and proximal common iliac arteries bilaterally with 7 mm diameter kissing balloons.  5. Percutaneous transluminal angioplasty of left common iliac artery and external iliac artery with a 7 mm diameter angioplasty balloon.  6. Self-expanding stent placement to left common iliac artery and external iliac artery for greater than 50% residual stenosis and dissection after angioplasty.  7. StarClose closure device bilateral femoral arteries.   SURGEON: Algernon Huxley, MD   ANESTHESIA: Local with moderate conscious sedation.   ESTIMATED BLOOD LOSS: 25 mL.  CONTRAST USED: 70 mL of Visipaque.   INDICATION FOR PROCEDURE: The patient is a 69 year old white female who has a previous history of left iliac stent placement some 10 years ago at [facility]. She has bilateral lower extremity claudication, left worse than right; and noninvasive studies showed iliac stenosis. We discussed options. Due to the limiting nature of her symptoms, she desired intervention. The risks and benefits were discussed. Informed consent was obtained.   DESCRIPTION OF PROCEDURE: The patient was brought to the Vascular Interventional Radiology Suite. Groins were shaved and prepped and a sterile surgical  field was created. Due to the iliac disease and body habitus, ultrasound was used to access initially the right femoral artery. This was accessed without difficulty with a Seldinger needle and permanent image was recorded. A 5-French sheath was placed, and a pigtail catheter was placed in the aorta, AP aortogram, and then pulled down to the aortic bifurcation for pelvic obliques. This demonstrated normal renal vessels bilaterally. The right common iliac artery was moderately irregular with moderate stenosis in the 50% to 60% range. The terminal aorta was diseased although not highly stenotic. There was a left common iliac artery stent placed at the origin of the common iliac artery that had an approximately 80 to 90% stenosis in its proximal portion. The left common iliac artery below the stent down to the external iliac artery below the hypogastric artery had a long tapered stenosis in the 80% to 90% range as well. At this point, I accessed the left femoral artery under direct ultrasound guidance with a Seldinger needle and placed a 6-French sheath. I upsized to a 6-French sheath on the right and gave the patient 3500 units of intravenous heparin. With a Magic Torque Wire and a Kumpe catheter, I was able to cross the stenoses in the left iliac artery and confirm intraluminal flow in the aorta. I then replaced the Magic Torque Wire; 7 mm diameter kissing balloons were placed inflated from the last 2 cm of the aorta down into the common iliac arteries on each side. I then pulled the balloon down on the left to below the aortic bifurcation to treat the lesion that was more distal than the previously placed stent. Waists were taken, particularly on the left. A pigtail catheter was placed up on the right, and  completion aortography was performed. This demonstrated good flow in the right iliac system and distal aorta. The area of restenosis within the previously placed left common iliac stent had only approximately 20%  residual stenosis, however, the stenosis below the stent down to the external iliac artery persisted, and there was a dissection that was flow-limiting. For this reason, an 8 mm diameter x 8 cm length self-expanding stent was placed in the left common and external iliac arteries. This did not go to the aortic bifurcation and terminated at the distal point of the previously placed stent approximately 2 to 3 cm below the bifurcation. This was ironed out with a 7 mm balloon, and completion angiogram performed to a pigtail catheter from the right femoral sheath showed this area to now be widely patent without flow-limiting stenosis. Oblique arteriogram was performed of both femoral arteries with good flow, and a StarClose closure device was deployed in the usual fashion with excellent hemostatic result.      The patient tolerated the procedure well and was taken to the recovery room in stable condition.  ____________________________ Algernon Huxley, MD jsd:cbb D: 09/16/2011 12:39:13 ET T: 09/16/2011 12:52:34 ET JOB#: [id number]  cc: Algernon Huxley, MD, <Dictator> Deborra Medina, MD Algernon Huxley MD ELECTRONICALLY SIGNED 09/16/2011 16:17

## 2017-02-20 NOTE — Telephone Encounter (Signed)
Error
